# Patient Record
Sex: Female | Born: 1975
Health system: Southern US, Community
[De-identification: ages and names within clinical notes are randomized; demographics above are authoritative.]

## PROBLEM LIST (undated history)

## (undated) DIAGNOSIS — K649 Unspecified hemorrhoids: Secondary | ICD-10-CM

## (undated) DIAGNOSIS — I8290 Acute embolism and thrombosis of unspecified vein: Secondary | ICD-10-CM

## (undated) DIAGNOSIS — T7840XA Allergy, unspecified, initial encounter: Secondary | ICD-10-CM

## (undated) DIAGNOSIS — D649 Anemia, unspecified: Secondary | ICD-10-CM

## (undated) DIAGNOSIS — R011 Cardiac murmur, unspecified: Secondary | ICD-10-CM

## (undated) DIAGNOSIS — G709 Myoneural disorder, unspecified: Secondary | ICD-10-CM

## (undated) DIAGNOSIS — J45909 Unspecified asthma, uncomplicated: Secondary | ICD-10-CM

## (undated) DIAGNOSIS — K529 Noninfective gastroenteritis and colitis, unspecified: Secondary | ICD-10-CM

## (undated) HISTORY — DX: Noninfective gastroenteritis and colitis, unspecified: K52.9

## (undated) HISTORY — DX: Anemia, unspecified: D64.9

## (undated) HISTORY — DX: Acute embolism and thrombosis of unspecified vein: I82.90

## (undated) HISTORY — PX: WISDOM TOOTH EXTRACTION: SHX21

## (undated) HISTORY — DX: Unspecified asthma, uncomplicated: J45.909

## (undated) HISTORY — DX: Allergy, unspecified, initial encounter: T78.40XA

## (undated) HISTORY — PX: APPENDECTOMY: SHX54

## (undated) HISTORY — DX: Myoneural disorder, unspecified: G70.9

## (undated) HISTORY — DX: Cardiac murmur, unspecified: R01.1

## (undated) HISTORY — PX: KNEE SURGERY: SHX244

---

## 2003-01-21 ENCOUNTER — Other Ambulatory Visit: Admission: RE | Admit: 2003-01-21 | Discharge: 2003-01-21 | Payer: Self-pay | Admitting: Obstetrics and Gynecology

## 2004-04-12 ENCOUNTER — Other Ambulatory Visit: Admission: RE | Admit: 2004-04-12 | Discharge: 2004-04-12 | Payer: Self-pay | Admitting: Obstetrics & Gynecology

## 2004-04-12 ENCOUNTER — Other Ambulatory Visit: Admission: RE | Admit: 2004-04-12 | Discharge: 2004-04-12 | Payer: Self-pay | Admitting: Gynecology

## 2004-09-03 ENCOUNTER — Emergency Department (HOSPITAL_COMMUNITY): Admission: EM | Admit: 2004-09-03 | Discharge: 2004-09-04 | Payer: Self-pay | Admitting: Emergency Medicine

## 2004-09-15 ENCOUNTER — Ambulatory Visit (HOSPITAL_COMMUNITY): Admission: RE | Admit: 2004-09-15 | Discharge: 2004-09-15 | Payer: Self-pay

## 2004-09-25 ENCOUNTER — Emergency Department (HOSPITAL_COMMUNITY): Admission: EM | Admit: 2004-09-25 | Discharge: 2004-09-25 | Payer: Self-pay | Admitting: Emergency Medicine

## 2004-09-26 ENCOUNTER — Emergency Department (HOSPITAL_COMMUNITY): Admission: EM | Admit: 2004-09-26 | Discharge: 2004-09-26 | Payer: Self-pay | Admitting: Emergency Medicine

## 2006-05-31 ENCOUNTER — Ambulatory Visit (HOSPITAL_COMMUNITY): Admission: RE | Admit: 2006-05-31 | Discharge: 2006-05-31 | Payer: Self-pay | Admitting: Obstetrics & Gynecology

## 2006-05-31 ENCOUNTER — Encounter (INDEPENDENT_AMBULATORY_CARE_PROVIDER_SITE_OTHER): Payer: Self-pay | Admitting: Specialist

## 2006-10-17 ENCOUNTER — Observation Stay (HOSPITAL_COMMUNITY): Admission: EM | Admit: 2006-10-17 | Discharge: 2006-10-18 | Payer: Self-pay | Admitting: Emergency Medicine

## 2007-06-16 ENCOUNTER — Inpatient Hospital Stay (HOSPITAL_COMMUNITY): Admission: AD | Admit: 2007-06-16 | Discharge: 2007-06-18 | Payer: Self-pay | Admitting: Obstetrics & Gynecology

## 2007-11-16 ENCOUNTER — Emergency Department (HOSPITAL_COMMUNITY): Admission: EM | Admit: 2007-11-16 | Discharge: 2007-11-16 | Payer: Self-pay | Admitting: Emergency Medicine

## 2007-11-20 ENCOUNTER — Encounter: Admission: RE | Admit: 2007-11-20 | Discharge: 2007-11-20 | Payer: Self-pay | Admitting: Family Medicine

## 2008-02-23 ENCOUNTER — Emergency Department (HOSPITAL_COMMUNITY): Admission: EM | Admit: 2008-02-23 | Discharge: 2008-02-23 | Payer: Self-pay | Admitting: Emergency Medicine

## 2008-09-08 ENCOUNTER — Emergency Department (HOSPITAL_BASED_OUTPATIENT_CLINIC_OR_DEPARTMENT_OTHER): Admission: EM | Admit: 2008-09-08 | Discharge: 2008-09-08 | Payer: Self-pay | Admitting: Emergency Medicine

## 2009-02-03 ENCOUNTER — Emergency Department (HOSPITAL_BASED_OUTPATIENT_CLINIC_OR_DEPARTMENT_OTHER): Admission: EM | Admit: 2009-02-03 | Discharge: 2009-02-03 | Payer: Self-pay | Admitting: Emergency Medicine

## 2009-07-19 ENCOUNTER — Inpatient Hospital Stay (HOSPITAL_COMMUNITY): Admission: AD | Admit: 2009-07-19 | Discharge: 2009-07-19 | Payer: Self-pay | Admitting: Obstetrics & Gynecology

## 2009-07-20 ENCOUNTER — Inpatient Hospital Stay (HOSPITAL_COMMUNITY): Admission: AD | Admit: 2009-07-20 | Discharge: 2009-07-22 | Payer: Self-pay | Admitting: Obstetrics and Gynecology

## 2009-12-01 ENCOUNTER — Emergency Department (HOSPITAL_BASED_OUTPATIENT_CLINIC_OR_DEPARTMENT_OTHER): Admission: EM | Admit: 2009-12-01 | Discharge: 2009-12-01 | Payer: Self-pay | Admitting: Emergency Medicine

## 2010-02-07 ENCOUNTER — Emergency Department (HOSPITAL_BASED_OUTPATIENT_CLINIC_OR_DEPARTMENT_OTHER): Admission: EM | Admit: 2010-02-07 | Discharge: 2010-02-07 | Payer: Self-pay | Admitting: Emergency Medicine

## 2010-04-06 ENCOUNTER — Emergency Department (HOSPITAL_BASED_OUTPATIENT_CLINIC_OR_DEPARTMENT_OTHER): Admission: EM | Admit: 2010-04-06 | Discharge: 2010-04-06 | Payer: Self-pay | Admitting: Emergency Medicine

## 2011-01-02 ENCOUNTER — Emergency Department (HOSPITAL_BASED_OUTPATIENT_CLINIC_OR_DEPARTMENT_OTHER)
Admission: EM | Admit: 2011-01-02 | Discharge: 2011-01-03 | Disposition: A | Discharge status: Home / Self Care | Payer: 59 | Attending: Emergency Medicine | Admitting: Emergency Medicine

## 2011-01-02 DIAGNOSIS — J069 Acute upper respiratory infection, unspecified: Secondary | ICD-10-CM | POA: Insufficient documentation

## 2011-01-09 LAB — BASIC METABOLIC PANEL
BUN: 19 mg/dL (ref 6–23)
Chloride: 106 mEq/L (ref 96–112)
Creatinine, Ser: 0.6 mg/dL (ref 0.4–1.2)
GFR calc Af Amer: >60 mL/min (ref >60)
GFR calc non Af Amer: >60 mL/min (ref >60)
Potassium: 3.5 mEq/L (ref 3.5–5.1)

## 2011-01-10 LAB — BASIC METABOLIC PANEL
CO2: 29 mEq/L (ref 19–32)
Calcium: 9.1 mg/dL (ref 8.4–10.5)
Chloride: 103 mEq/L (ref 96–112)
Creatinine, Ser: 0.7 mg/dL (ref 0.4–1.2)
GFR calc Af Amer: >60 mL/min (ref >60)
Sodium: 143 mEq/L (ref 135–145)

## 2011-01-10 LAB — URINALYSIS, ROUTINE W REFLEX MICROSCOPIC
Glucose, UA: NEGATIVE mg/dL
Nitrite: NEGATIVE
Specific Gravity, Urine: 1.024 (ref 1.005–1.030)
pH: 5 (ref 5.0–8.0)

## 2011-01-10 LAB — DIFFERENTIAL
Basophils Relative: 0 % (ref 0–1)
Eosinophils Relative: 2 % (ref 0–5)
Lymphocytes Relative: 10 % — ABNORMAL LOW (ref 12–46)
Neutro Abs: 15.2 10*3/uL — ABNORMAL HIGH (ref 1.7–7.7)
Neutrophils Relative %: 84 % — ABNORMAL HIGH (ref 43–77)

## 2011-01-10 LAB — CBC
Hemoglobin: 14.3 g/dL (ref 12.0–15.0)
RBC: 4.49 MIL/uL (ref 3.87–5.11)
WBC: 18.1 10*3/uL — ABNORMAL HIGH (ref 4.0–10.5)

## 2011-01-10 LAB — PREGNANCY, URINE: Preg Test, Ur: NEGATIVE

## 2011-01-26 LAB — CBC
Hemoglobin: 10.8 g/dL — ABNORMAL LOW (ref 12.0–15.0)
Hemoglobin: 12.2 g/dL (ref 12.0–15.0)
MCV: 88.9 fL (ref 78.0–100.0)
Platelets: 231 10*3/uL (ref 150–400)
RBC: 3.56 MIL/uL — ABNORMAL LOW (ref 3.87–5.11)
RBC: 4.11 MIL/uL (ref 3.87–5.11)
RDW: 15 % (ref 11.5–15.5)
RDW: 15.4 % (ref 11.5–15.5)
WBC: 11.2 10*3/uL — ABNORMAL HIGH (ref 4.0–10.5)
WBC: 12.8 10*3/uL — ABNORMAL HIGH (ref 4.0–10.5)

## 2011-01-31 LAB — URINALYSIS, ROUTINE W REFLEX MICROSCOPIC
Bilirubin Urine: NEGATIVE
Nitrite: NEGATIVE
Protein, ur: NEGATIVE mg/dL
Specific Gravity, Urine: 1.015 (ref 1.005–1.030)
Urobilinogen, UA: 0.2 mg/dL (ref 0.0–1.0)

## 2011-03-09 NOTE — Discharge Summary (Signed)
NAME:  Mariah [name], Mariah [name]                ACCOUNT NO.:  [id number]   MEDICAL RECORD NO.:  [id number]          PATIENT TYPE:  INP   LOCATION:  5014                         FACILITY:  [facility]   PHYSICIAN:  Corinna L. Lendell Caprice, MDDATE OF BIRTH:  [birth year]   DATE OF ADMISSION:  10/17/2006  DATE OF DISCHARGE:  10/18/2006                               DISCHARGE SUMMARY   DISCHARGE DIAGNOSES:  1. Viral gastroenteritis.  2. Intrauterine pregnancy 5 weeks.   DISCHARGE MEDICATIONS:  1. Phenergan 12.5 mg p.o. q.6 h. p.r.n. nausea.  2. Imodium 2 mg p.o. as needed for diarrhea.   FOLLOWUP:  With of Dr. Varney Baas.   CONDITION:  Stable.   ACTIVITY:  Ad lib.   DIET:  Should include adequate fluids.   CONSULTATIONS:  None.   PROCEDURES:  None.   LABS:  CBC is significant for A white blood cell count of 13,000;  otherwise unremarkable.  Complete metabolic panel unremarkable, except  for an albumin of 3.2.  Amylase and lipase normal.  Quantitative hCG  6302 and urine hCG positive.  Specific gravity 1.022, ketones 40,  negative blood, negative protein, negative nitrite, negative leukocyte  esterase.   HISTORY AND HOSPITAL COURSE:  Mariah [name] is a pleasant 35 year old  unassigned Hispanic female who returned from [place] recently; and has  had intractable vomiting and diarrhea for 24 hours.  Her last menstrual  period was November 14.  Her husband has been admitted, reportedly, with  similar symptoms.  She had continued intractable vomiting in the  emergency room despite Zofran and Phenergan.  She was also having some  abdominal pain and cramping; so she was admitted for IV fluids and  supportive care.   Her vomiting and diarrhea improved quickly.  At this time she is  tolerating a liquid diet and crackers, actually also small amounts of  solid food.  She is feeling better; and is able to be discharged.  She  is to followup with Dr. Jennette Kettle for prenatal care.      Corinna L. Lendell Caprice, MD  Electronically Signed     CLS/MEDQ  D:  10/18/2006  T:  10/18/2006  Job:  [id number]

## 2011-03-09 NOTE — Op Note (Signed)
[name], [name]             ACCOUNT NO.:  [id number]   MEDICAL RECORD NO.:  [id number]          PATIENT TYPE:  AMB   LOCATION:  SDC                           FACILITY:  [facility]   PHYSICIAN:  Freddy Finner, M.D.   DATE OF BIRTH:  July 01, 1976   DATE OF PROCEDURE:  05/30/2006  DATE OF DISCHARGE:                                 OPERATIVE REPORT   PREOPERATIVE DIAGNOSIS:  Missed abortion.   POSTOPERATIVE DIAGNOSIS:  Missed abortion.   PROCEDURE:  D&E.   SURGEON:  Freddy Finner, M.D.   ANESTHESIA:  IV sedation and paracervical block.   INTRAOPERATIVE COMPLICATIONS:  None.   ESTIMATED BLOOD LOSS:  100 cc or less.   INDICATIONS FOR PROCEDURE:  The patient is a 35 year old Spanish single  female with her fourth pregnancy who has previously had pregnancy  termination and two spontaneous abortions.  She presented to the office on  May 28, 2006 for initial obstetrical evaluation and at that time by dates  was 11-1/[redacted] weeks gestation.  On her examination, the uterus was felt to be  smaller than dates, and ultrasound was obtained which showed a 9-week size  empty sac with no evidence of fetal parts or heart activity or yolk sac.  A  diagnosis of missed abortion was made.  She is now admitted for D&E.   DESCRIPTION OF PROCEDURE:  She was admitted on the morning of surgery.  She  was brought to the operating room and there placed under IV sedation.  She  was placed in the dorsal lithotomy position.  Betadine prep of the mons,  perineum, and vagina was carried out.  Sterile drapes were applied.   A bivalve speculum was introduced to visualize the cervix which was grasped  on the anterior lip with a single-tooth tenaculum.  The cervix was  progressively dilated with Pratts to allow passage of an 8-mm suction  cannula.  Application of vacuum suction did produce products of conception.  Gentle sharp curettage and exploration with Randall stone forceps was  carried out followed by repeat  vacuum aspiration until it was felt that the  cavity was completely evacuated.  The procedure was terminated.  The  instruments were removed.   The patient was awakened and taken to recovery in good condition.  She was  given Darvocet-N 100 to be taken one or two every four hours as needed for  postoperative pain unrelieved by over-the-counter preparations.      Freddy Finner, M.D.  Electronically Signed     WRN/MEDQ  D:  06/01/2006  T:  06/01/2006  Job:  [id number]

## 2011-03-09 NOTE — H&P (Signed)
NAME:  Mariah [name], Mariah [name]                ACCOUNT NO.:  [id number]   MEDICAL RECORD NO.:  [id number]          PATIENT TYPE:  INP   LOCATION:  5014                         FACILITY:  [facility]   PHYSICIAN:  Corinna L. Lendell Caprice, MDDATE OF BIRTH:  1976/08/22   DATE OF ADMISSION:  10/17/2006  DATE OF DISCHARGE:  10/18/2006                              HISTORY & PHYSICAL   CHIEF COMPLAINT:  Vomiting and diarrhea.   HPI:  Mariah [name] is an, unassigned, 35 year old Hispanic female who has  had 24 hours of intractable vomiting and diarrhea.  She has had Zofran,  Dilaudid and Phenergan and continues to have intractable vomiting here  in the emergency room and I am asked to admit for supportive care.  Patient's husband was admitted recently to the hospital with similar  symptoms.  She recently returned from [place].  She has had some  subjective fevers and chills.  Her last menstrual period was September 04, 2006.   PAST MEDICAL HISTORY:  None.   MEDICATIONS:  None.   ALLERGIES:  NONE.   SOCIAL HISTORY:  Patient is married.  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  She  denies drinking, smoking or drugs.   FAMILY HISTORY:  Noncontributory.   REVIEW OF SYSTEMS:  As above; otherwise, negative.   PHYSICAL EXAMINATION:  VITAL SIGNS:  Temperature is 99.7.  Heart rate  116.  Respiratory rate 20.  Blood pressure 199/67.  GENERAL:  Patient is uncomfortable appearing white female with an emesis  basin at her side.  HEENT:  Normocephalic, atraumatic.  Pupils equal, round and reactive to  light.  Slightly dry mucous membranes.  NECK:  Supple.  No  lymphadenopathy.  LUNGS:  Clear to auscultation bilaterally without wheezes, rhonchi or  rales.  CARDIOVASCULAR:  Fast, regular.  No murmurs, gallops or rubs.  ABDOMEN:  Normal bowel sounds.  Soft, mild tenderness.  GU/RECTAL:  Deferred.  EXTREMITIES:  No clubbing, cyanosis or edema.  SKIN:  No rash.  PSYCHIATRIC:  Normal affect.  NEUROLOGIC:  Alert and oriented.   Cranial nerves and sensory motor exam  are intact.   LABS:  UA is negative for nitrites, leukocyte esterase, blood, protein,  there is 40 ketones, specific gravity 1.022.  Urine hCG positive,  quantitative hCG 6302.  BMET unremarkable.  H&H unremarkable.   SPECIAL STUDIES IN RADIOLOGY:  Pelvic ultrasound shows a probable  gestational sac with yoke sac within, corresponds to an intrauterine  pregnancy of approximately 5 weeks 4 days.  Probable left ovarian corpus  luteal cyst.   ASSESSMENT/PLAN:  1. Vomiting and diarrhea.  Suspect viral gastroenteritis as patient      has failed to improve in the emergency room, I will admit her for      IV fluids, antiemetics and supportive care.  2. Five week intrauterine pregnancy.  She has seen Dr. Jennette Kettle in the      past and will need outpatient followup.      Corinna L. Lendell Caprice, MD  Electronically Signed     CLS/MEDQ  D:  10/18/2006  T:  10/19/2006  Job:  [id number]

## 2011-07-12 LAB — DIFFERENTIAL
Basophils Absolute: 0
Basophils Relative: 0
Lymphocytes Relative: 17
Monocytes Absolute: 0.3
Neutro Abs: 3.4
Neutrophils Relative %: 75

## 2011-07-12 LAB — CBC
Hemoglobin: 13.9
MCHC: 35.3
Platelets: 228
RDW: 13.3

## 2011-07-12 LAB — BASIC METABOLIC PANEL
CO2: 23
Calcium: 9
Creatinine, Ser: 0.6
GFR calc non Af Amer: >60
Glucose, Bld: 109 — ABNORMAL HIGH
Sodium: 133 — ABNORMAL LOW

## 2011-07-25 LAB — RAPID STREP SCREEN (MED CTR MEBANE ONLY): Streptococcus, Group A Screen (Direct): NEGATIVE

## 2011-08-03 LAB — CBC
HCT: 31 — ABNORMAL LOW
Hemoglobin: 10.7 — ABNORMAL LOW
Hemoglobin: 12.9
MCHC: 35.1
RBC: 3.35 — ABNORMAL LOW
RBC: 4.02
RDW: 14.5 — ABNORMAL HIGH
WBC: 12.5 — ABNORMAL HIGH
WBC: 17.7 — ABNORMAL HIGH

## 2011-08-03 LAB — RAPID HIV SCREEN (WH-MAU): Rapid HIV Screen: NONREACTIVE

## 2011-08-03 LAB — RPR: RPR Ser Ql: NONREACTIVE

## 2015-06-08 ENCOUNTER — Encounter (HOSPITAL_BASED_OUTPATIENT_CLINIC_OR_DEPARTMENT_OTHER): Payer: Self-pay

## 2015-06-08 ENCOUNTER — Emergency Department (HOSPITAL_BASED_OUTPATIENT_CLINIC_OR_DEPARTMENT_OTHER): Payer: 59

## 2015-06-08 ENCOUNTER — Emergency Department (HOSPITAL_BASED_OUTPATIENT_CLINIC_OR_DEPARTMENT_OTHER)
Admission: EM | Admit: 2015-06-08 | Discharge: 2015-06-08 | Disposition: A | Discharge status: Home / Self Care | Payer: 59 | Attending: Emergency Medicine | Admitting: Emergency Medicine

## 2015-06-08 DIAGNOSIS — Z3202 Encounter for pregnancy test, result negative: Secondary | ICD-10-CM | POA: Insufficient documentation

## 2015-06-08 DIAGNOSIS — R1013 Epigastric pain: Secondary | ICD-10-CM | POA: Insufficient documentation

## 2015-06-08 DIAGNOSIS — K625 Hemorrhage of anus and rectum: Secondary | ICD-10-CM | POA: Insufficient documentation

## 2015-06-08 HISTORY — DX: Unspecified hemorrhoids: K64.9

## 2015-06-08 LAB — URINALYSIS, ROUTINE W REFLEX MICROSCOPIC
BILIRUBIN URINE: NEGATIVE
Glucose, UA: NEGATIVE mg/dL
KETONES UR: NEGATIVE mg/dL
Leukocytes, UA: NEGATIVE
NITRITE: NEGATIVE
PH: 5.5 (ref 5.0–8.0)
Protein, ur: NEGATIVE mg/dL
Specific Gravity, Urine: 1.024 (ref 1.005–1.030)
UROBILINOGEN UA: 0.2 mg/dL (ref 0.0–1.0)

## 2015-06-08 LAB — BASIC METABOLIC PANEL
ANION GAP: 7 (ref 5–15)
BUN: 14 mg/dL (ref 6–20)
CALCIUM: 9.1 mg/dL (ref 8.9–10.3)
CO2: 26 mmol/L (ref 22–32)
CREATININE: 0.62 mg/dL (ref 0.44–1.00)
Chloride: 103 mmol/L (ref 101–111)
GLUCOSE: 137 mg/dL — AB (ref 65–99)
Potassium: 3.6 mmol/L (ref 3.5–5.1)
Sodium: 136 mmol/L (ref 135–145)

## 2015-06-08 LAB — CBC WITH DIFFERENTIAL/PLATELET
BASOS ABS: 0 10*3/uL (ref 0.0–0.1)
BASOS PCT: 0 % (ref 0–1)
EOS PCT: 0 % (ref 0–5)
Eosinophils Absolute: 0.1 10*3/uL (ref 0.0–0.7)
HCT: 39.8 % (ref 36.0–46.0)
Hemoglobin: 13.7 g/dL (ref 12.0–15.0)
Lymphocytes Relative: 8 % — ABNORMAL LOW (ref 12–46)
Lymphs Abs: 1.1 10*3/uL (ref 0.7–4.0)
MCH: 31 pg (ref 26.0–34.0)
MCHC: 34.4 g/dL (ref 30.0–36.0)
MCV: 90 fL (ref 78.0–100.0)
MONO ABS: 1 10*3/uL (ref 0.1–1.0)
Monocytes Relative: 7 % (ref 3–12)
Neutro Abs: 11.4 10*3/uL — ABNORMAL HIGH (ref 1.7–7.7)
Neutrophils Relative %: 85 % — ABNORMAL HIGH (ref 43–77)
PLATELETS: 244 10*3/uL (ref 150–400)
RBC: 4.42 MIL/uL (ref 3.87–5.11)
RDW: 12.5 % (ref 11.5–15.5)
WBC: 13.5 10*3/uL — ABNORMAL HIGH (ref 4.0–10.5)

## 2015-06-08 LAB — URINE MICROSCOPIC-ADD ON

## 2015-06-08 LAB — PREGNANCY, URINE: Preg Test, Ur: NEGATIVE

## 2015-06-08 LAB — OCCULT BLOOD X 1 CARD TO LAB, STOOL: FECAL OCCULT BLD: POSITIVE — AB

## 2015-06-08 MED ORDER — SODIUM CHLORIDE 0.9 % IV BOLUS (SEPSIS)
500.0000 mL | Freq: Once | INTRAVENOUS | Status: AC
  Administered 2015-06-08: 500 mL via INTRAVENOUS

## 2015-06-08 MED ORDER — LIDOCAINE HCL 2 % EX GEL: 1.0000 "application " | CUTANEOUS | Status: DC | PRN

## 2015-06-08 MED ORDER — IOHEXOL 300 MG/ML  SOLN
100.0000 mL | Freq: Once | INTRAMUSCULAR | Status: AC | PRN
  Administered 2015-06-08: 100 mL via INTRAVENOUS

## 2015-06-08 MED ORDER — IOHEXOL 300 MG/ML  SOLN
25.0000 mL | Freq: Once | INTRAMUSCULAR | Status: AC | PRN
  Administered 2015-06-08: 25 mL via ORAL

## 2015-06-08 MED ORDER — BISACODYL 5 MG PO TBEC: 5.0000 mg | DELAYED_RELEASE_TABLET | Freq: Two times a day (BID) | ORAL | Status: DC

## 2015-06-08 NOTE — ED Provider Notes (Signed)
CSN: [id number]     Arrival date & time 06/08/15  1406 History   First MD Initiated Contact with Patient 06/08/15 1417     Chief Complaint  Patient presents with  . Rectal Bleeding     (Consider location/radiation/quality/duration/timing/severity/associated sxs/prior Treatment) Patient is a 39 y.o. female presenting with hematochezia. The history is provided by the patient. No language interpreter was used.  Rectal Bleeding Associated symptoms: abdominal pain   Associated symptoms: no fever and no vomiting   Ms. Swartz is a 39 y.o female with a history of hemorrhoid who presents for rectal bleeding that began at 8 AM this morning. She states she had bleeding even without having a bowel movement. She reports the blood was bright red. Soon after she states she began feeling weak, nauseated with abdominal pain. She states she has had this before and was diagnosed with colitis when she lived in [place] but has not had any further testing and has never followed up with a gastroenterologist. Last BM was this morning. She denies any fever, chills, vomiting, diarrhea, mucous stool, dysuria, hematuria, urinary frequency, near syncope.  Past Medical History  Diagnosis Date  . Hemorrhoid    Past Surgical History  Procedure Laterality Date  . Knee surgery    . Appendectomy     No family history on file. Social History  Substance Use Topics  . Smoking status: Never Smoker   . Smokeless tobacco: None  . Alcohol Use: No   OB History    No data available     Review of Systems  Constitutional: Negative for fever.  Respiratory: Negative for shortness of breath.   Cardiovascular: Negative for chest pain.  Gastrointestinal: Positive for nausea, abdominal pain, blood in stool and hematochezia. Negative for vomiting, diarrhea, constipation and abdominal distention.  Genitourinary: Negative for dysuria.  All other systems reviewed and are negative.     Allergies  Robitussin (alcohol  free)  Home Medications   Prior to Admission medications   Medication Sig Start Date End Date Taking? Authorizing Provider  bisacodyl (DULCOLAX) 5 MG EC tablet Take 1 tablet (5 mg total) by mouth 2 (two) times daily. 06/08/15   Seung Nidiffer Patel-Mills, PA-C  lidocaine (XYLOCAINE) 2 % jelly Apply 1 application topically as needed. 06/08/15   Charleston Vierling Patel-Mills, PA-C   BP 114/68 mmHg  Pulse 89  Temp(Src) 98.3 F (36.8 C) (Oral)  Resp 20  SpO2 100%  LMP 05/11/2015 (Approximate) Physical Exam  Constitutional: She is oriented to person, place, and time. She appears well-developed and well-nourished.  HENT:  Head: Normocephalic and atraumatic.  Eyes: Conjunctivae are normal.  Neck: Normal range of motion. Neck supple.  Cardiovascular: Normal rate, regular rhythm and normal heart sounds.   Pulmonary/Chest: Effort normal and breath sounds normal. No respiratory distress.  Abdominal: Soft. Normal appearance. She exhibits no distension. There is tenderness in the epigastric area. There is no rebound and no guarding.    Genitourinary: Pelvic exam was performed with patient in the knee-chest position.  Rectal exam: Chaperone present. Significant tenderness on rectal exam. Moderate amounts of blood with no stool. No hemorrhoids.  Musculoskeletal: Normal range of motion.  Neurological: She is alert and oriented to person, place, and time.  Skin: Skin is warm and dry.  Psychiatric: She has a normal mood and affect. Her behavior is normal.  Nursing note and vitals reviewed.   ED Course  Procedures (including critical care time) Labs Review Labs Reviewed  OCCULT BLOOD X 1 CARD TO LAB,  STOOL - Abnormal; Notable for the following:    Fecal Occult Bld POSITIVE (*)    All other components within normal limits  CBC WITH DIFFERENTIAL/PLATELET - Abnormal; Notable for the following:    WBC 13.5 (*)    Neutrophils Relative % 85 (*)    Neutro Abs 11.4 (*)    Lymphocytes Relative 8 (*)    All other  components within normal limits  BASIC METABOLIC PANEL - Abnormal; Notable for the following:    Glucose, Bld 137 (*)    All other components within normal limits  URINALYSIS, ROUTINE W REFLEX MICROSCOPIC (NOT AT [facility]) - Abnormal; Notable for the following:    Hgb urine dipstick TRACE (*)    All other components within normal limits  PREGNANCY, URINE  URINE MICROSCOPIC-ADD ON    Imaging Review Ct Abdomen Pelvis W Contrast  06/08/2015   CLINICAL DATA:  Lower abdominal pain, rectal pain with bleeding. Nausea.  EXAM: CT ABDOMEN AND PELVIS WITH CONTRAST  TECHNIQUE: Multidetector CT imaging of the abdomen and pelvis was performed using the standard protocol following bolus administration of intravenous contrast.  CONTRAST:  25mL OMNIPAQUE IOHEXOL 300 MG/ML SOLN, OMNIPAQUE IOHEXOL 300 MG/ML SOLN  COMPARISON:  None.  FINDINGS: Lung bases are clear.  No effusions.  Heart is normal size.  Liver, gallbladder, spleen, pancreas, adrenals and kidneys are unremarkable. No hydronephrosis. Uterus, adnexae and urinary bladder unremarkable. Trace free fluid in the pelvis, likely physiologic.  Stomach, large and small bowel are unremarkable. No free air or adenopathy. Aorta is normal caliber.  No acute bony abnormality or focal bone lesion.  IMPRESSION: No acute findings in the abdomen or pelvis.   Electronically Signed   By: Charlett Nose M.D.   On: 06/08/2015 16:13   I have personally reviewed and evaluated these images and lab results as part of my medical decision-making.   EKG Interpretation None      MDM   Final diagnoses:  Rectal bleeding   Vitals are stable and she is well appearing.  No pallor or tachycardia. She does have rectal bleeding but hgb is stable.  No UTI and other labs are not concerning.  CT abdomen is negative for acute colitis, rectal abscess, or diverticulitis.  I discussed findings and return precautions with the patient.  I have given her GI follow.  Patient verbally agrees  with the plan. I prescribed lidocaine jelly and dulcolax. Medications  sodium chloride 0.9 % bolus 500 mL (0 mLs Intravenous Stopped 06/08/15 1608)  iohexol (OMNIPAQUE) 300 MG/ML solution 25 mL (25 mLs Oral Contrast Given 06/08/15 1513)  iohexol (OMNIPAQUE) 300 MG/ML solution 100 mL (100 mLs Intravenous Contrast Given 06/08/15 1549)      Catha Gosselin, PA-C 06/08/15 1724  Benjiman Core, MD 06/08/15 2322

## 2015-06-08 NOTE — Discharge Instructions (Signed)
Bloody Stools Follow up with GI. Return for fever, worsening pain or rectal pain, vomiting. Bloody stools often mean that there is a problem in the digestive tract. Your caregiver may use the term "melena" to describe black, tarry, and bad smelling stools or "hematochezia" to describe red or maroon-colored stools. Blood seen in the stool can be caused by bleeding anywhere along the intestinal tract.  A black stool usually means that blood is coming from the upper part of the gastrointestinal tract (esophagus, stomach, or small bowel). Passing maroon-colored stools or bright red blood usually means that blood is coming from lower down in the large bowel or the rectum. However, sometimes massive bleeding in the stomach or small intestine can cause bright red bloody stools.  Consuming black licorice, lead, iron pills, medicines containing bismuth subsalicylate, or blueberries can also cause black stools. Your caregiver can test black stools to see if blood is present. It is important that the cause of the bleeding be found. Treatment can then be started, and the problem can be corrected. Rectal bleeding may not be serious, but you should not assume everything is okay until you know the cause.It is very important to follow up with your caregiver or a specialist in gastrointestinal problems. CAUSES  Blood in the stools can come from various underlying causes.Often, the cause is not found during your first visit. Testing is often needed to discover the cause of bleeding in the gastrointestinal tract. Causes range from simple to serious or even life-threatening.Possible causes include:  Hemorrhoids.These are veins that are full of blood (engorged) in the rectum. They cause pain, inflammation, and may bleed.  Anal fissures.These are areas of painful tearing which may bleed. They are often caused by passing hard stool.  Diverticulosis.These are pouches that form on the colon over time, with age, and may  bleed significantly.  Diverticulitis.This is inflammation in areas with diverticulosis. It can cause pain, fever, and bloody stools, although bleeding is rare.  Proctitis and colitis. These are inflamed areas of the rectum or colon. They may cause pain, fever, and bloody stools.  Polyps and cancer. Colon cancer is a leading cause of preventable cancer death.It often starts out as precancerous polyps that can be removed during a colonoscopy, preventing progression into cancer. Sometimes, polyps and cancer may cause rectal bleeding.  Gastritis and ulcers.Bleeding from the upper gastrointestinal tract (near the stomach) may travel through the intestines and produce black, sometimes tarry, often bad smelling stools. In certain cases, if the bleeding is fast enough, the stools may not be black, but red and the condition may be life-threatening. SYMPTOMS  You may have stools that are bright red and bloody, that are normal color with blood on them, or that are dark black and tarry. In some cases, you may only have blood in the toilet bowl. Any of these cases need medical care. You may also have:  Pain at the anus or anywhere in the rectum.  Lightheadedness or feeling faint.  Extreme weakness.  Nausea or vomiting.  Fever. DIAGNOSIS Your caregiver may use the following methods to find the cause of your bleeding:  Taking a medical history. Age is important. Older people tend to develop polyps and cancer more often. If there is anal pain and a hard, large stool associated with bleeding, a tear of the anus may be the cause. If blood drips into the toilet after a bowel movement, bleeding hemorrhoids may be the problem. The color and frequency of the bleeding are additional  considerations. In most cases, the medical history provides clues, but seldom the final answer.  A visual and finger (digital) exam. Your caregiver will inspect the anal area, looking for tears and hemorrhoids. A finger exam can  provide information when there is tenderness or a growth inside. In men, the prostate is also examined.  Endoscopy. Several types of small, long scopes (endoscopes) are used to view the colon.  In the office, your caregiver may use a rigid, or more commonly, a flexible viewing sigmoidoscope. This exam is called flexible sigmoidoscopy. It is performed in 5 to 10 minutes.  A more thorough exam is accomplished with a colonoscope. It allows your caregiver to view the entire 5 to 6 foot long colon. Medicine to help you relax (sedative) is usually given for this exam. Frequently, a bleeding lesion may be present beyond the reach of the sigmoidoscope. So, a colonoscopy may be the best exam to start with. Both exams are usually done on an outpatient basis. This means the patient does not stay overnight in the hospital or surgery center.  An upper endoscopy may be needed to examine your stomach. Sedation is used and a flexible endoscope is put in your mouth, down to your stomach.  A barium enema X-ray. This is an X-ray exam. It uses liquid barium inserted by enema into the rectum. This test alone may not identify an actual bleeding point. X-rays highlight abnormal shadows, such as those made by lumps (tumors), diverticuli, or colitis. TREATMENT  Treatment depends on the cause of your bleeding.   For bleeding from the stomach or colon, the caregiver doing your endoscopy or colonoscopy may be able to stop the bleeding as part of the procedure.  Inflammation or infection of the colon can be treated with medicines.  Many rectal problems can be treated with creams, suppositories, or warm baths.  Surgery is sometimes needed.  Blood transfusions are sometimes needed if you have lost a lot of blood.  For any bleeding problem, let your caregiver know if you take aspirin or other blood thinners regularly. HOME CARE INSTRUCTIONS   Take any medicines exactly as prescribed.  Keep your stools soft by eating a  diet high in fiber. Prunes (1 to 3 a day) work well for many people.  Drink enough water and fluids to keep your urine clear or pale yellow.  Take sitz baths if advised. A sitz bath is when you sit in a bathtub with warm water for 10 to 15 minutes to soak, soothe, and cleanse the rectal area.  If enemas or suppositories are advised, be sure you know how to use them. Tell your caregiver if you have problems with this.  Monitor your bowel movements to look for signs of improvement or worsening. SEEK MEDICAL CARE IF:   You do not improve in the time expected.  Your condition worsens after initial improvement.  You develop any new symptoms. SEEK IMMEDIATE MEDICAL CARE IF:   You develop severe or prolonged rectal bleeding.  You vomit blood.  You feel weak or faint.  You have a fever. MAKE SURE YOU:  Understand these instructions.  Will watch your condition.  Will get help right away if you are not doing well or get worse. Document Released: 09/28/2002 Document Revised: 12/31/2011 Document Reviewed: 02/23/2011 [facility] Patient Information 2015 [place], [place]. This information is not intended to replace advice given to you by your health care provider. Make sure you discuss any questions you have with your health care provider.

## 2015-06-08 NOTE — ED Notes (Signed)
Rectal bleeding x 2 with BM today and once without BM-c/o weakness, nausea, abd pain and HA

## 2015-06-08 NOTE — ED Notes (Signed)
Patient departing with spouse.  Pt heading to pharmacy to pick up prescriptions.

## 2015-06-09 ENCOUNTER — Telehealth: Payer: Self-pay | Admitting: Internal Medicine

## 2015-06-09 NOTE — Telephone Encounter (Signed)
Pt was seen in the ER for abdominal pain and rectal bleeding, told to follow-up with GI. Pt scheduled to see Doug Sou PA tomorrow at 1:30pm. Pt aware of appt.

## 2015-06-09 NOTE — Telephone Encounter (Signed)
Attempted to call pt. Received message that voicemail is full. Will try again later.

## 2015-06-10 ENCOUNTER — Ambulatory Visit (INDEPENDENT_AMBULATORY_CARE_PROVIDER_SITE_OTHER): Payer: 59 | Admitting: Gastroenterology

## 2015-06-10 ENCOUNTER — Encounter: Payer: Self-pay | Admitting: Gastroenterology

## 2015-06-10 VITALS — BP 98/60 | HR 72 | Ht 61.5 in | Wt 137.4 lb

## 2015-06-10 DIAGNOSIS — R1031 Right lower quadrant pain: Secondary | ICD-10-CM | POA: Diagnosis not present

## 2015-06-10 DIAGNOSIS — R197 Diarrhea, unspecified: Secondary | ICD-10-CM | POA: Diagnosis not present

## 2015-06-10 DIAGNOSIS — K625 Hemorrhage of anus and rectum: Secondary | ICD-10-CM | POA: Diagnosis not present

## 2015-06-10 MED ORDER — NA SULFATE-K SULFATE-MG SULF 17.5-3.13-1.6 GM/177ML PO SOLN: 1.0000 | Freq: Once | ORAL | Status: DC

## 2015-06-10 MED ORDER — DICYCLOMINE HCL 10 MG PO CAPS: 10.0000 mg | ORAL_CAPSULE | Freq: Two times a day (BID) | ORAL | Status: DC

## 2015-06-10 NOTE — Progress Notes (Signed)
06/10/2015 Mariah [name] [id number] [birth year]   HISTORY OF PRESENT ILLNESS:  This is a 39 year old female who had sudden onset of nausea, diarrhea, rectal bleeding, and abdominal pain 3 days ago.  Referred here from the ED where she was 2 days ago for these symptoms.  Was doing well until these symptoms began suddenly.  Now have 4 yellow, watery stools daily.  Passing some bright red blood at times even without stool (on TP and in toilet).  Having lower abdominal pain, mostly in RLQ.  Apparently had some similar symptoms in the past when she lived in [place] and was diagnosed with colitis, but no GI work-up done.  Denies recent travel, sick contacts, antibiotics, and bad food exposure.  CT scan abdomen and pelvis with contrast 2 days ago, 8/17, was unremarkable.  This was during ED visit.  At that time labs revealed WBC count somewhat elevated at 13.5; Hgb normal at 13.7 grams.  BMP ok.   Past Medical History  Diagnosis Date  . Hemorrhoid   . Colitis    Past Surgical History  Procedure Laterality Date  . Knee surgery Right     tumor removed  . Appendectomy      reports that she has never smoked. She has never used smokeless tobacco. She reports that she drinks alcohol. She reports that she does not use illicit drugs. family history includes Cancer in her paternal grandmother; Gallstones in her mother; Nephrolithiasis in her sister; Pancreatic cancer in her father; Prostate cancer in her maternal grandfather. Allergies  Allergen Reactions  . Robitussin (Alcohol Free) [Guaifenesin] Swelling      Outpatient Encounter Prescriptions as of 06/10/2015  Medication Sig  . bisacodyl (DULCOLAX) 5 MG EC tablet Take 1 tablet (5 mg total) by mouth 2 (two) times daily.  Marland Kitchen lidocaine (XYLOCAINE) 2 % jelly Apply 1 application topically as needed.  . Multiple Vitamin (MULTIVITAMIN) tablet Take 1 tablet by mouth daily.   No facility-administered encounter medications on file as of 06/10/2015.      REVIEW OF SYSTEMS  : All other systems reviewed and negative except where noted in the History of Present Illness.   PHYSICAL EXAM: BP 98/60 mmHg  Pulse 72  Ht 5' 1.5" (1.562 m)  Wt 137 lb 6 oz (62.313 kg)  BMI 25.54 kg/m2  LMP 05/11/2015 (Approximate) General: Well developed white female in no acute distress, but appears somewhat uncomfortable. Head: Normocephalic and atraumatic Eyes:  Sclerae anicteric,conjunctiva pink. Ears: Normal auditory acuity Lungs: Clear throughout to auscultation Heart: Regular rate and rhythm Abdomen: Soft, non-distended.  Normal bowel sounds.  Mild diffuse TTP but greatest in RLQ without R/R/G. Musculoskeletal: Symmetrical with no gross deformities  Skin: No lesions on visible extremities Extremities: No edema  Neurological: Alert oriented x 4, grossly non-focal Psychological:  Alert and cooperative. Normal mood and affect  ASSESSMENT AND PLAN: -Sudden onset diarrhea, rectal bleeding, abdominal pain, and nausea:  CT scan negative.  Certainly sounds infectious, but apparently diagnosed with "colitis" in [place], but no issues until 3 days ago.  ? If that was infectious as well.  Will check stool culture and Cdiff.  If stool studies negative and symptoms persist then will proceed with colonoscopy.  Will schedule today with Dr. Marina Goodell and can cancel if needed. The risks, benefits, and alternatives to colonoscopy were discussed with the patient and she consents to proceed.  Will try bentyl 10 mg BID for abdominal cramping.  If symptoms worsen then will return to  the ED.     CC:  Mitchell, L.August Saucer, MD

## 2015-06-10 NOTE — Patient Instructions (Signed)
Your physician has requested that you go to the basement for  lab work before leaving today.  We have sent the following medications to your pharmacy for you to pick up at your convenience:Bentyl.  You have been scheduled for a colonoscopy. Please follow written instructions given to you at your visit today.  Please pick up your prep supplies at the pharmacy within the next 1-3 days. If you use inhalers (even only as needed), please bring them with you on the day of your procedure.

## 2015-06-13 ENCOUNTER — Other Ambulatory Visit: Payer: 59

## 2015-06-13 DIAGNOSIS — K625 Hemorrhage of anus and rectum: Secondary | ICD-10-CM

## 2015-06-13 DIAGNOSIS — R197 Diarrhea, unspecified: Secondary | ICD-10-CM

## 2015-06-13 DIAGNOSIS — R1031 Right lower quadrant pain: Secondary | ICD-10-CM

## 2015-06-14 LAB — CLOSTRIDIUM DIFFICILE BY PCR: Toxigenic C. Difficile by PCR: NOT DETECTED

## 2015-06-17 LAB — STOOL CULTURE

## 2015-06-19 DIAGNOSIS — R1031 Right lower quadrant pain: Secondary | ICD-10-CM | POA: Insufficient documentation

## 2015-06-19 DIAGNOSIS — R197 Diarrhea, unspecified: Secondary | ICD-10-CM | POA: Insufficient documentation

## 2015-06-19 DIAGNOSIS — K625 Hemorrhage of anus and rectum: Secondary | ICD-10-CM | POA: Insufficient documentation

## 2015-06-20 NOTE — Progress Notes (Signed)
Agree with initial assessment and plans as outlined 

## 2015-06-28 ENCOUNTER — Ambulatory Visit (AMBULATORY_SURGERY_CENTER): Payer: 59 | Admitting: Internal Medicine

## 2015-06-28 ENCOUNTER — Encounter: Payer: Self-pay | Admitting: Internal Medicine

## 2015-06-28 VITALS — BP 100/59 | HR 63 | Temp 97.7°F | Resp 16 | Ht 61.5 in | Wt 137.0 lb

## 2015-06-28 DIAGNOSIS — K625 Hemorrhage of anus and rectum: Secondary | ICD-10-CM | POA: Diagnosis not present

## 2015-06-28 DIAGNOSIS — R197 Diarrhea, unspecified: Secondary | ICD-10-CM

## 2015-06-28 MED ORDER — SODIUM CHLORIDE 0.9 % IV SOLN: 500.0000 mL | INTRAVENOUS | Status: DC

## 2015-06-28 NOTE — Progress Notes (Signed)
To recovery, report to Mirts, RN, VSS. 

## 2015-06-28 NOTE — Op Note (Signed)
[facility] [address] [place], [address]   COLONOSCOPY PROCEDURE REPORT  PATIENT: Mariah [name], Mariah [name]  MR#: [id number] BIRTHDATE: [birth year] , 38  yrs. old GENDER: female ENDOSCOPIST: Roxy Cedar, MD REFERRED XB:JYNW Mitchell, M.D. PROCEDURE DATE:  06/28/2015 PROCEDURE:   Colonoscopy, diagnostic First Screening Colonoscopy - Avg.  risk and is 50 yrs.  old or older - No.  Prior Negative Screening - Now for repeat screening. N/A  History of Adenoma - Now for follow-up colonoscopy & has been > or = to 3 yrs.  N/A  Polyps removed today? No Recommend repeat exam, <10 yrs? No ASA CLASS:   Class I INDICATIONS:change in bowel habits(loose) and rectal bleeding. Reported unspecified "colitis" in [place] previously. MEDICATIONS: Monitored anesthesia care and Propofol 200 mg IV  DESCRIPTION OF PROCEDURE:   After the risks benefits and alternatives of the procedure were thoroughly explained, informed consent was obtained.  The digital rectal exam revealed no abnormalities of the rectum.   The [id number]  endoscope was introduced through the anus and advanced to the cecum, which was identified by both the appendix and ileocecal valve. No adverse events experienced.   The quality of the prep was excellent. (MoviPrep was used)  The instrument was then slowly withdrawn as the colon was fully examined. Estimated blood loss is zero unless otherwise noted in this procedure report.  COLON FINDINGS: The examined terminal ileum appeared to be normal. A normal appearing cecum, ileocecal valve, and appendiceal orifice were identified.  The ascending, transverse, descending, sigmoid colon, and rectum appeared unremarkable.  Retroflexed views revealed internal hemorrhoids. The time to cecum = 3.9 Withdrawal time = 5.5   The scope was withdrawn and the procedure completed. COMPLICATIONS: There were no immediate complications.  ENDOSCOPIC IMPRESSION: 1.   The examined  terminal ileum appeared normal 2.   Normal colonoscopy 3.  Suspect self-limited infectious process 3 weeks ago. Now with occasional colonic spasm. No evidence for inflammatory bowel disease or active colitis  RECOMMENDATIONS: 1.  Continue current medications. Okay to use dicyclomine (Bentyl) if needed for abdominal discomfort 2.  Continue current colorectal screening recommendations for "routine risk" patients with a repeat colonoscopy age 40. 3. Return to the care of your primary provider. GI follow-up as needed  eSigned:  Roxy Cedar, MD 06/28/2015 8:20 AM   cc: The Patient and Lupe Carney, MD

## 2015-06-28 NOTE — Patient Instructions (Signed)
YOU HAD AN ENDOSCOPIC PROCEDURE TODAY AT THE [facility]:   Refer to the procedure report that was given to you for any specific questions about what was found during the examination.  If the procedure report does not answer your questions, please call your gastroenterologist to clarify.  If you requested that your care partner not be given the details of your procedure findings, then the procedure report has been included in a sealed envelope for you to review at your convenience later.  YOU SHOULD EXPECT: Some feelings of bloating in the abdomen. Passage of more gas than usual.  Walking can help get rid of the air that was put into your GI tract during the procedure and reduce the bloating. If you had a lower endoscopy (such as a colonoscopy or flexible sigmoidoscopy) you may notice spotting of blood in your stool or on the toilet paper. If you underwent a bowel prep for your procedure, you may not have a normal bowel movement for a few days.  Please Note:  You might notice some irritation and congestion in your nose or some drainage.  This is from the oxygen used during your procedure.  There is no need for concern and it should clear up in a day or so.  SYMPTOMS TO REPORT IMMEDIATELY:   Following lower endoscopy (colonoscopy or flexible sigmoidoscopy):  Excessive amounts of blood in the stool  Significant tenderness or worsening of abdominal pains  Swelling of the abdomen that is new, acute  Fever of 100F or higher  For urgent or emergent issues, a gastroenterologist can be reached at any hour by calling [phone].   DIET: Your first meal following the procedure should be a small meal and then it is ok to progress to your normal diet. Heavy or fried foods are harder to digest and may make you feel nauseous or bloated.  Likewise, meals heavy in dairy and vegetables can increase bloating.  Drink plenty of fluids but you should avoid alcoholic beverages for 24  hours.  ACTIVITY:  You should plan to take it easy for the rest of today and you should NOT DRIVE or use heavy machinery until tomorrow (because of the sedation medicines used during the test).    FOLLOW UP: Our staff will call the number listed on your records the next business day following your procedure to check on you and address any questions or concerns that you may have regarding the information given to you following your procedure. If we do not reach you, we will leave a message.  However, if you are feeling well and you are not experiencing any problems, there is no need to return our call.  We will assume that you have returned to your regular daily activities without incident.  If any biopsies were taken you will be contacted by phone or by letter within the next 1-3 weeks.  Please call us at [phone] if you have not heard about the biopsies in 3 weeks.    SIGNATURES/CONFIDENTIALITY: You and/or your care partner have signed paperwork which will be entered into your electronic medical record.  These signatures attest to the fact that that the information above on your After Visit Summary has been reviewed and is understood.  Full responsibility of the confidentiality of this discharge information lies with you and/or your care-partner.  Okay to use dicyclomine if needed for abdominal pain.  Repeat colonoscopy at age 39 for screening.

## 2015-06-29 ENCOUNTER — Telehealth: Payer: Self-pay

## 2015-06-29 NOTE — Telephone Encounter (Signed)
Attempt call back, voice mailbox full, unable to leave message.

## 2016-11-22 DIAGNOSIS — M9902 Segmental and somatic dysfunction of thoracic region: Secondary | ICD-10-CM | POA: Diagnosis not present

## 2016-11-22 DIAGNOSIS — M9901 Segmental and somatic dysfunction of cervical region: Secondary | ICD-10-CM | POA: Diagnosis not present

## 2016-11-22 DIAGNOSIS — M9903 Segmental and somatic dysfunction of lumbar region: Secondary | ICD-10-CM | POA: Diagnosis not present

## 2016-11-26 DIAGNOSIS — M9901 Segmental and somatic dysfunction of cervical region: Secondary | ICD-10-CM | POA: Diagnosis not present

## 2016-11-26 DIAGNOSIS — M9903 Segmental and somatic dysfunction of lumbar region: Secondary | ICD-10-CM | POA: Diagnosis not present

## 2016-11-26 DIAGNOSIS — M9902 Segmental and somatic dysfunction of thoracic region: Secondary | ICD-10-CM | POA: Diagnosis not present

## 2016-11-29 DIAGNOSIS — M9903 Segmental and somatic dysfunction of lumbar region: Secondary | ICD-10-CM | POA: Diagnosis not present

## 2016-11-29 DIAGNOSIS — M9901 Segmental and somatic dysfunction of cervical region: Secondary | ICD-10-CM | POA: Diagnosis not present

## 2016-11-29 DIAGNOSIS — M9902 Segmental and somatic dysfunction of thoracic region: Secondary | ICD-10-CM | POA: Diagnosis not present

## 2016-12-06 DIAGNOSIS — M9902 Segmental and somatic dysfunction of thoracic region: Secondary | ICD-10-CM | POA: Diagnosis not present

## 2016-12-06 DIAGNOSIS — M9901 Segmental and somatic dysfunction of cervical region: Secondary | ICD-10-CM | POA: Diagnosis not present

## 2016-12-06 DIAGNOSIS — M9903 Segmental and somatic dysfunction of lumbar region: Secondary | ICD-10-CM | POA: Diagnosis not present

## 2016-12-10 DIAGNOSIS — M9901 Segmental and somatic dysfunction of cervical region: Secondary | ICD-10-CM | POA: Diagnosis not present

## 2016-12-10 DIAGNOSIS — M9903 Segmental and somatic dysfunction of lumbar region: Secondary | ICD-10-CM | POA: Diagnosis not present

## 2016-12-10 DIAGNOSIS — M9902 Segmental and somatic dysfunction of thoracic region: Secondary | ICD-10-CM | POA: Diagnosis not present

## 2016-12-13 DIAGNOSIS — M9901 Segmental and somatic dysfunction of cervical region: Secondary | ICD-10-CM | POA: Diagnosis not present

## 2016-12-13 DIAGNOSIS — M9903 Segmental and somatic dysfunction of lumbar region: Secondary | ICD-10-CM | POA: Diagnosis not present

## 2016-12-13 DIAGNOSIS — M9902 Segmental and somatic dysfunction of thoracic region: Secondary | ICD-10-CM | POA: Diagnosis not present

## 2016-12-18 DIAGNOSIS — M9903 Segmental and somatic dysfunction of lumbar region: Secondary | ICD-10-CM | POA: Diagnosis not present

## 2016-12-18 DIAGNOSIS — M9902 Segmental and somatic dysfunction of thoracic region: Secondary | ICD-10-CM | POA: Diagnosis not present

## 2016-12-18 DIAGNOSIS — M9901 Segmental and somatic dysfunction of cervical region: Secondary | ICD-10-CM | POA: Diagnosis not present

## 2016-12-20 DIAGNOSIS — M9901 Segmental and somatic dysfunction of cervical region: Secondary | ICD-10-CM | POA: Diagnosis not present

## 2016-12-20 DIAGNOSIS — M9902 Segmental and somatic dysfunction of thoracic region: Secondary | ICD-10-CM | POA: Diagnosis not present

## 2016-12-20 DIAGNOSIS — M9903 Segmental and somatic dysfunction of lumbar region: Secondary | ICD-10-CM | POA: Diagnosis not present

## 2016-12-24 DIAGNOSIS — M9902 Segmental and somatic dysfunction of thoracic region: Secondary | ICD-10-CM | POA: Diagnosis not present

## 2016-12-24 DIAGNOSIS — M9903 Segmental and somatic dysfunction of lumbar region: Secondary | ICD-10-CM | POA: Diagnosis not present

## 2016-12-24 DIAGNOSIS — M9901 Segmental and somatic dysfunction of cervical region: Secondary | ICD-10-CM | POA: Diagnosis not present

## 2016-12-31 DIAGNOSIS — M9902 Segmental and somatic dysfunction of thoracic region: Secondary | ICD-10-CM | POA: Diagnosis not present

## 2016-12-31 DIAGNOSIS — M9903 Segmental and somatic dysfunction of lumbar region: Secondary | ICD-10-CM | POA: Diagnosis not present

## 2016-12-31 DIAGNOSIS — M9901 Segmental and somatic dysfunction of cervical region: Secondary | ICD-10-CM | POA: Diagnosis not present

## 2017-01-07 DIAGNOSIS — M9903 Segmental and somatic dysfunction of lumbar region: Secondary | ICD-10-CM | POA: Diagnosis not present

## 2017-01-07 DIAGNOSIS — M9901 Segmental and somatic dysfunction of cervical region: Secondary | ICD-10-CM | POA: Diagnosis not present

## 2017-01-07 DIAGNOSIS — M9902 Segmental and somatic dysfunction of thoracic region: Secondary | ICD-10-CM | POA: Diagnosis not present

## 2017-01-15 DIAGNOSIS — R21 Rash and other nonspecific skin eruption: Secondary | ICD-10-CM | POA: Diagnosis not present

## 2017-01-16 DIAGNOSIS — M9903 Segmental and somatic dysfunction of lumbar region: Secondary | ICD-10-CM | POA: Diagnosis not present

## 2017-01-16 DIAGNOSIS — M9902 Segmental and somatic dysfunction of thoracic region: Secondary | ICD-10-CM | POA: Diagnosis not present

## 2017-01-16 DIAGNOSIS — M9901 Segmental and somatic dysfunction of cervical region: Secondary | ICD-10-CM | POA: Diagnosis not present

## 2017-01-30 DIAGNOSIS — M9903 Segmental and somatic dysfunction of lumbar region: Secondary | ICD-10-CM | POA: Diagnosis not present

## 2017-01-30 DIAGNOSIS — M9902 Segmental and somatic dysfunction of thoracic region: Secondary | ICD-10-CM | POA: Diagnosis not present

## 2017-01-30 DIAGNOSIS — M9901 Segmental and somatic dysfunction of cervical region: Secondary | ICD-10-CM | POA: Diagnosis not present

## 2017-02-12 DIAGNOSIS — M9902 Segmental and somatic dysfunction of thoracic region: Secondary | ICD-10-CM | POA: Diagnosis not present

## 2017-02-12 DIAGNOSIS — M9903 Segmental and somatic dysfunction of lumbar region: Secondary | ICD-10-CM | POA: Diagnosis not present

## 2017-02-12 DIAGNOSIS — M9901 Segmental and somatic dysfunction of cervical region: Secondary | ICD-10-CM | POA: Diagnosis not present

## 2017-03-05 DIAGNOSIS — M9901 Segmental and somatic dysfunction of cervical region: Secondary | ICD-10-CM | POA: Diagnosis not present

## 2017-03-05 DIAGNOSIS — M9902 Segmental and somatic dysfunction of thoracic region: Secondary | ICD-10-CM | POA: Diagnosis not present

## 2017-03-05 DIAGNOSIS — M9903 Segmental and somatic dysfunction of lumbar region: Secondary | ICD-10-CM | POA: Diagnosis not present

## 2017-04-03 DIAGNOSIS — M9902 Segmental and somatic dysfunction of thoracic region: Secondary | ICD-10-CM | POA: Diagnosis not present

## 2017-04-03 DIAGNOSIS — M9901 Segmental and somatic dysfunction of cervical region: Secondary | ICD-10-CM | POA: Diagnosis not present

## 2017-04-03 DIAGNOSIS — M9903 Segmental and somatic dysfunction of lumbar region: Secondary | ICD-10-CM | POA: Diagnosis not present

## 2017-05-03 DIAGNOSIS — M9902 Segmental and somatic dysfunction of thoracic region: Secondary | ICD-10-CM | POA: Diagnosis not present

## 2017-05-03 DIAGNOSIS — M9903 Segmental and somatic dysfunction of lumbar region: Secondary | ICD-10-CM | POA: Diagnosis not present

## 2017-05-03 DIAGNOSIS — M9901 Segmental and somatic dysfunction of cervical region: Secondary | ICD-10-CM | POA: Diagnosis not present

## 2017-06-03 DIAGNOSIS — M9903 Segmental and somatic dysfunction of lumbar region: Secondary | ICD-10-CM | POA: Diagnosis not present

## 2017-06-03 DIAGNOSIS — M9902 Segmental and somatic dysfunction of thoracic region: Secondary | ICD-10-CM | POA: Diagnosis not present

## 2017-06-03 DIAGNOSIS — M9901 Segmental and somatic dysfunction of cervical region: Secondary | ICD-10-CM | POA: Diagnosis not present

## 2017-06-04 DIAGNOSIS — M9902 Segmental and somatic dysfunction of thoracic region: Secondary | ICD-10-CM | POA: Diagnosis not present

## 2017-06-04 DIAGNOSIS — M9901 Segmental and somatic dysfunction of cervical region: Secondary | ICD-10-CM | POA: Diagnosis not present

## 2017-06-04 DIAGNOSIS — M9903 Segmental and somatic dysfunction of lumbar region: Secondary | ICD-10-CM | POA: Diagnosis not present

## 2017-06-05 DIAGNOSIS — M62838 Other muscle spasm: Secondary | ICD-10-CM | POA: Diagnosis not present

## 2017-06-05 DIAGNOSIS — M9901 Segmental and somatic dysfunction of cervical region: Secondary | ICD-10-CM | POA: Diagnosis not present

## 2017-06-05 DIAGNOSIS — M9902 Segmental and somatic dysfunction of thoracic region: Secondary | ICD-10-CM | POA: Diagnosis not present

## 2017-06-05 DIAGNOSIS — M9903 Segmental and somatic dysfunction of lumbar region: Secondary | ICD-10-CM | POA: Diagnosis not present

## 2017-06-11 DIAGNOSIS — M542 Cervicalgia: Secondary | ICD-10-CM | POA: Diagnosis not present

## 2017-06-11 DIAGNOSIS — M79602 Pain in left arm: Secondary | ICD-10-CM | POA: Diagnosis not present

## 2017-09-25 DIAGNOSIS — J301 Allergic rhinitis due to pollen: Secondary | ICD-10-CM | POA: Diagnosis not present

## 2017-09-25 DIAGNOSIS — J3089 Other allergic rhinitis: Secondary | ICD-10-CM | POA: Diagnosis not present

## 2017-09-25 DIAGNOSIS — J3081 Allergic rhinitis due to animal (cat) (dog) hair and dander: Secondary | ICD-10-CM | POA: Diagnosis not present

## 2018-03-12 DIAGNOSIS — L237 Allergic contact dermatitis due to plants, except food: Secondary | ICD-10-CM | POA: Diagnosis not present

## 2018-03-19 DIAGNOSIS — L259 Unspecified contact dermatitis, unspecified cause: Secondary | ICD-10-CM | POA: Diagnosis not present

## 2018-05-17 ENCOUNTER — Emergency Department (HOSPITAL_BASED_OUTPATIENT_CLINIC_OR_DEPARTMENT_OTHER): Payer: 59

## 2018-05-17 ENCOUNTER — Encounter (HOSPITAL_BASED_OUTPATIENT_CLINIC_OR_DEPARTMENT_OTHER): Payer: Self-pay | Admitting: Emergency Medicine

## 2018-05-17 ENCOUNTER — Emergency Department (HOSPITAL_BASED_OUTPATIENT_CLINIC_OR_DEPARTMENT_OTHER)
Admission: EM | Admit: 2018-05-17 | Discharge: 2018-05-17 | Disposition: A | Discharge status: Home / Self Care | Payer: 59 | Attending: Emergency Medicine | Admitting: Emergency Medicine

## 2018-05-17 ENCOUNTER — Other Ambulatory Visit: Payer: Self-pay

## 2018-05-17 DIAGNOSIS — D729 Disorder of white blood cells, unspecified: Secondary | ICD-10-CM | POA: Diagnosis not present

## 2018-05-17 DIAGNOSIS — K2920 Alcoholic gastritis without bleeding: Secondary | ICD-10-CM | POA: Insufficient documentation

## 2018-05-17 DIAGNOSIS — J45909 Unspecified asthma, uncomplicated: Secondary | ICD-10-CM | POA: Diagnosis not present

## 2018-05-17 DIAGNOSIS — Z3202 Encounter for pregnancy test, result negative: Secondary | ICD-10-CM | POA: Diagnosis not present

## 2018-05-17 DIAGNOSIS — R197 Diarrhea, unspecified: Secondary | ICD-10-CM | POA: Diagnosis not present

## 2018-05-17 DIAGNOSIS — Z79899 Other long term (current) drug therapy: Secondary | ICD-10-CM | POA: Insufficient documentation

## 2018-05-17 DIAGNOSIS — R1110 Vomiting, unspecified: Secondary | ICD-10-CM | POA: Diagnosis not present

## 2018-05-17 DIAGNOSIS — R112 Nausea with vomiting, unspecified: Secondary | ICD-10-CM

## 2018-05-17 DIAGNOSIS — R109 Unspecified abdominal pain: Secondary | ICD-10-CM | POA: Diagnosis not present

## 2018-05-17 DIAGNOSIS — R1084 Generalized abdominal pain: Secondary | ICD-10-CM | POA: Insufficient documentation

## 2018-05-17 DIAGNOSIS — K529 Noninfective gastroenteritis and colitis, unspecified: Secondary | ICD-10-CM | POA: Insufficient documentation

## 2018-05-17 LAB — CBC WITH DIFFERENTIAL/PLATELET
Basophils Absolute: 0 10*3/uL (ref 0.0–0.1)
Basophils Relative: 0 %
Eosinophils Absolute: 0 10*3/uL (ref 0.0–0.7)
Eosinophils Relative: 0 %
HEMATOCRIT: 40.6 % (ref 36.0–46.0)
Hemoglobin: 14.1 g/dL (ref 12.0–15.0)
LYMPHS PCT: 8 %
Lymphs Abs: 1.4 10*3/uL (ref 0.7–4.0)
MCH: 31.5 pg (ref 26.0–34.0)
MCHC: 34.7 g/dL (ref 30.0–36.0)
MCV: 90.6 fL (ref 78.0–100.0)
MONO ABS: 0.9 10*3/uL (ref 0.1–1.0)
MONOS PCT: 5 %
NEUTROS ABS: 15.4 10*3/uL — AB (ref 1.7–7.7)
Neutrophils Relative %: 87 %
Platelets: 362 10*3/uL (ref 150–400)
RBC: 4.48 MIL/uL (ref 3.87–5.11)
RDW: 13.3 % (ref 11.5–15.5)
WBC: 17.7 10*3/uL — ABNORMAL HIGH (ref 4.0–10.5)

## 2018-05-17 LAB — COMPREHENSIVE METABOLIC PANEL
ALT: 16 U/L (ref 0–44)
AST: 19 U/L (ref 15–41)
Albumin: 4.3 g/dL (ref 3.5–5.0)
Alkaline Phosphatase: 72 U/L (ref 38–126)
Anion gap: 13 (ref 5–15)
BUN: 9 mg/dL (ref 6–20)
CO2: 23 mmol/L (ref 22–32)
Calcium: 9.5 mg/dL (ref 8.9–10.3)
Chloride: 106 mmol/L (ref 98–111)
Creatinine, Ser: 0.61 mg/dL (ref 0.44–1.00)
Glucose, Bld: 123 mg/dL — ABNORMAL HIGH (ref 70–99)
Potassium: 3.8 mmol/L (ref 3.5–5.1)
Sodium: 142 mmol/L (ref 135–145)
TOTAL PROTEIN: 7.9 g/dL (ref 6.5–8.1)
Total Bilirubin: 0.4 mg/dL (ref 0.3–1.2)

## 2018-05-17 LAB — URINALYSIS, ROUTINE W REFLEX MICROSCOPIC
Bilirubin Urine: NEGATIVE
GLUCOSE, UA: NEGATIVE mg/dL
HGB URINE DIPSTICK: NEGATIVE
Ketones, ur: 40 mg/dL — AB
Leukocytes, UA: NEGATIVE
Nitrite: NEGATIVE
PROTEIN: NEGATIVE mg/dL
Specific Gravity, Urine: 1.01 (ref 1.005–1.030)
pH: 7.5 (ref 5.0–8.0)

## 2018-05-17 LAB — PREGNANCY, URINE: Preg Test, Ur: NEGATIVE

## 2018-05-17 LAB — LIPASE, BLOOD: Lipase: 29 U/L (ref 11–51)

## 2018-05-17 MED ORDER — SODIUM CHLORIDE 0.9 % IV BOLUS
2000.0000 mL | Freq: Once | INTRAVENOUS | Status: AC
  Administered 2018-05-17: 2000 mL via INTRAVENOUS

## 2018-05-17 MED ORDER — PROMETHAZINE HCL 25 MG RE SUPP: 25.0000 mg | Freq: Four times a day (QID) | RECTAL | 0 refills | Status: DC | PRN

## 2018-05-17 MED ORDER — PROMETHAZINE HCL 25 MG/ML IJ SOLN
25.0000 mg | Freq: Once | INTRAMUSCULAR | Status: AC
Start: 2018-05-17 — End: 2018-05-17
  Administered 2018-05-17: 25 mg via INTRAVENOUS
  Filled 2018-05-17: qty 1

## 2018-05-17 MED ORDER — GI COCKTAIL ~~LOC~~
30.0000 mL | Freq: Once | ORAL | Status: DC
  Filled 2018-05-17: qty 30

## 2018-05-17 MED ORDER — ONDANSETRON 4 MG PO TBDP: 4.0000 mg | ORAL_TABLET | Freq: Three times a day (TID) | ORAL | 0 refills | Status: DC | PRN

## 2018-05-17 MED ORDER — ONDANSETRON HCL 4 MG/2ML IJ SOLN
4.0000 mg | Freq: Once | INTRAMUSCULAR | Status: AC
  Administered 2018-05-17: 4 mg via INTRAVENOUS
  Filled 2018-05-17: qty 2

## 2018-05-17 MED ORDER — MORPHINE SULFATE (PF) 4 MG/ML IV SOLN
4.0000 mg | Freq: Once | INTRAVENOUS | Status: AC
  Administered 2018-05-17: 4 mg via INTRAVENOUS
  Filled 2018-05-17: qty 1

## 2018-05-17 MED ORDER — RANITIDINE HCL 150 MG PO TABS: 150.0000 mg | ORAL_TABLET | Freq: Two times a day (BID) | ORAL | 0 refills | Status: DC

## 2018-05-17 MED ORDER — IOPAMIDOL (ISOVUE-300) INJECTION 61%
100.0000 mL | Freq: Once | INTRAVENOUS | Status: AC | PRN
  Administered 2018-05-17: 100 mL via INTRAVENOUS

## 2018-05-17 MED ORDER — FAMOTIDINE IN NACL 20-0.9 MG/50ML-% IV SOLN
20.0000 mg | Freq: Once | INTRAVENOUS | Status: AC
  Administered 2018-05-17: 20 mg via INTRAVENOUS
  Filled 2018-05-17: qty 50

## 2018-05-17 NOTE — ED Triage Notes (Signed)
Vomiting since last night. Reports drinking too much.

## 2018-05-17 NOTE — ED Notes (Signed)
Per Radiology protocol, CT of abdomen and pelvis will need to wait for urine pregnancy test to result prior to imaging UNLESS pt chooses to sign waiver.

## 2018-05-17 NOTE — ED Notes (Signed)
Patient was asked if she was able to provide urine sample. Patient said she could not at this time.

## 2018-05-17 NOTE — ED Provider Notes (Addendum)
[facility] EMERGENCY DEPARTMENT Provider Note   CSN: [id number] Arrival date & time: 05/17/18  1329     History   Chief Complaint Chief Complaint  Patient presents with  . Emesis    HPI Mariah [name] is a 42 y.o. female with a PMHx of anemia, asthma, colitis, neuropathy, and other conditions listed below, and PSHx of appendectomy, who presents to the ED with complaints of nausea, vomiting, diarrhea, chills, and abdominal pain that began this morning.  Patient states that she drank quite a bit of alcohol last night, and this morning woke up vomiting.  She has had 7-8 episodes of nonbloody nonbilious emesis and 4-5 episodes of nonbloody diarrhea with associated chills.  She describes her pain as 10/10 intermittent sharp epigastric pain that radiates into the right upper back, worse with laying down, and with no treatments tried prior to arrival.  She cannot recall exactly how much alcohol she had, but states that she was taking tequila shots.  Of note, she has had colitis once before remotely, denies being diagnosed with Crohn's or ulcerative colitis, states that it was a one-time thing that she does not have to take any medicines for on an ongoing basis.  Her PCP is Dr. Clovis Riley at [facility].  She denies any recent foreign travel, sick contacts, suspicious food intake, antibiotics, or frequent NSAID use.  She denies fevers, CP, SOB, constipation, obstipation, melena, hematochezia, hematemesis, hematuria, dysuria, vaginal bleeding/discharge, myalgias, arthralgias, numbness, tingling, focal weakness, or any other complaints at this time.   The history is provided by the patient and medical records. No language interpreter was used.  Emesis   Associated symptoms include abdominal pain, chills and diarrhea. Pertinent negatives include no arthralgias, no fever and no myalgias.    Past Medical History:  Diagnosis Date  . Allergy   . Anemia   . Asthma   . Blood clot in vein    right arm - from IV  . Colitis   . Hemorrhoid   . Neuromuscular disorder (HCC)    neuropathy right arm    Patient Active Problem List   Diagnosis Date Noted  . Diarrhea 06/19/2015  . Rectal bleeding 06/19/2015  . Right lower quadrant abdominal pain 06/19/2015    Past Surgical History:  Procedure Laterality Date  . APPENDECTOMY    . KNEE SURGERY Right    tumor removed  . WISDOM TOOTH EXTRACTION       OB History   None      Home Medications    Prior to Admission medications   Medication Sig Start Date End Date Taking? Authorizing Provider  bisacodyl (DULCOLAX) 5 MG EC tablet Take 1 tablet (5 mg total) by mouth 2 (two) times daily. Patient not taking: Reported on 06/28/2015 06/08/15   Patel-Mills, Lorelle Formosa, PA-C  dicyclomine (BENTYL) 10 MG capsule Take 1 capsule (10 mg total) by mouth 2 (two) times daily. 06/10/15   Zehr, Shanda Bumps D, PA-C  lidocaine (XYLOCAINE) 2 % jelly Apply 1 application topically as needed. 06/08/15   Patel-Mills, Lorelle Formosa, PA-C  Multiple Vitamin (MULTIVITAMIN) tablet Take 1 tablet by mouth daily.    [provider]    Family History Family History  Problem Relation Age of Onset  . Gallstones Mother   . Pancreatic cancer Father   . Nephrolithiasis Sister   . Cancer Paternal Grandmother        type unknown  . Colon cancer Paternal Grandmother   . Prostate cancer Maternal Grandfather   .  Esophageal cancer Neg Hx   . Rectal cancer Neg Hx   . Stomach cancer Neg Hx     Social History Social History   Tobacco Use  . Smoking status: Never Smoker  . Smokeless tobacco: Never Used  Substance Use Topics  . Alcohol use: Yes    Alcohol/week: 0.0 oz    Comment: social  . Drug use: No     Allergies   Robitussin (alcohol free) [guaifenesin]   Review of Systems Review of Systems  Constitutional: Positive for chills. Negative for fever.  Respiratory: Negative for shortness of breath.   Cardiovascular: Negative for chest pain.    Gastrointestinal: Positive for abdominal pain, diarrhea, nausea and vomiting. Negative for blood in stool and constipation.  Genitourinary: Negative for dysuria, hematuria, vaginal bleeding and vaginal discharge.  Musculoskeletal: Negative for arthralgias and myalgias.  Skin: Negative for color change.  Allergic/Immunologic: Negative for immunocompromised state.  Neurological: Negative for weakness and numbness.  Psychiatric/Behavioral: Negative for confusion.   All other systems reviewed and are negative for acute change except as noted in the HPI.    Physical Exam Updated Vital Signs BP 111/81 (BP Location: Left Arm)   Pulse 100   Temp 97.8 F (36.6 C) (Oral)   Resp 18   Ht 5\' 2"  (1.575 m)   Wt 66.7 kg (147 lb)   LMP 05/06/2018   SpO2 97%   BMI 26.89 kg/m   Physical Exam  Constitutional: She is oriented to person, place, and time. Vital signs are normal. She appears well-developed and well-nourished.  Non-toxic appearance. No distress.  Afebrile, nontoxic, appears uncomfortable but in NAD  HENT:  Head: Normocephalic and atraumatic.  Mouth/Throat: Oropharynx is clear and moist and mucous membranes are normal.  Eyes: Conjunctivae and EOM are normal. Right eye exhibits no discharge. Left eye exhibits no discharge.  Neck: Normal range of motion. Neck supple.  Cardiovascular: Normal rate, regular rhythm, normal heart sounds and intact distal pulses. Exam reveals no gallop and no friction rub.  No murmur heard. Pulmonary/Chest: Effort normal and breath sounds normal. No respiratory distress. She has no decreased breath sounds. She has no wheezes. She has no rhonchi. She has no rales.  Abdominal: Soft. Normal appearance and bowel sounds are normal. She exhibits no distension. There is generalized tenderness. There is guarding and positive Murphy's sign (pain elicited but still able to mostly fully inspire). There is no rigidity, no rebound, no CVA tenderness and no tenderness at  McBurney's point.  Soft, nondistended, +BS throughout, with diffuse moderate abd TTP most focally in LLQ and across upper abdomen, some voluntary guarding, no rebound/rigidity, equivocal murphy's sign (pain elicited however pt able to mostly fully inspire), neg mcburney's, no CVA TTP   Musculoskeletal: Normal range of motion.  Neurological: She is alert and oriented to person, place, and time. She has normal strength. No sensory deficit.  Skin: Skin is warm, dry and intact. No rash noted.  Psychiatric: She has a normal mood and affect.  Nursing note and vitals reviewed.    ED Treatments / Results  Labs (all labs ordered are listed, but only abnormal results are displayed) Labs Reviewed  COMPREHENSIVE METABOLIC PANEL - Abnormal; Notable for the following components:      Result Value   Glucose, Bld 123 (*)    All other components within normal limits  URINALYSIS, ROUTINE W REFLEX MICROSCOPIC - Abnormal; Notable for the following components:   Color, Urine STRAW (*)    Ketones, ur 40 (*)  All other components within normal limits  CBC WITH DIFFERENTIAL/PLATELET - Abnormal; Notable for the following components:   WBC 17.7 (*)    Neutro Abs 15.4 (*)    All other components within normal limits  LIPASE, BLOOD  PREGNANCY, URINE    EKG None  Radiology Ct Abdomen Pelvis W Contrast  Result Date: 05/17/2018 CLINICAL DATA:  Generalized abdominal pain, nausea, vomiting, diarrhea. Prior appendectomy. EXAM: CT ABDOMEN AND PELVIS WITH CONTRAST TECHNIQUE: Multidetector CT imaging of the abdomen and pelvis was performed using the standard protocol following bolus administration of intravenous contrast. CONTRAST:  100mL ISOVUE-300 IOPAMIDOL (ISOVUE-300) INJECTION 61% COMPARISON:  06/08/2015 CT abdomen/pelvis. FINDINGS: Lower chest: No significant pulmonary nodules or acute consolidative airspace disease. Hepatobiliary: Normal liver size. No liver mass. Normal gallbladder with no radiopaque  cholelithiasis. No biliary ductal dilatation. Pancreas: Normal, with no mass or duct dilation. Spleen: Normal size. No mass. Adrenals/Urinary Tract: Normal adrenals. Normal kidneys with no hydronephrosis and no renal mass. Normal bladder. Stomach/Bowel: Normal non-distended stomach. Normal caliber small bowel with no small bowel wall thickening. Appendectomy. Normal large bowel with no diverticulosis, large bowel wall thickening or pericolonic fat stranding. Vascular/Lymphatic: Normal caliber abdominal aorta. Patent portal, splenic, hepatic and renal veins. No pathologically enlarged lymph nodes in the abdomen or pelvis. Reproductive: Grossly normal uterus. Small ring enhancing 1.3 cm right adnexal lesion is compatible with a corpus luteum (series 2/image 67). No additional adnexal masses. Other: No pneumoperitoneum, ascites or focal fluid collection. Musculoskeletal: No aggressive appearing focal osseous lesions. IMPRESSION: No acute abnormality. No evidence of bowel obstruction or acute bowel inflammation. Electronically Signed   By: Delbert PhenixJason A Poff M.D.   On: 05/17/2018 17:10    Procedures Procedures (including critical care time)  Medications Ordered in ED Medications  gi cocktail (Maalox,Lidocaine,Donnatal) (30 mLs Oral Not Given 05/17/18 2035)  sodium chloride 0.9 % bolus 2,000 mL (0 mLs Intravenous Stopped 05/17/18 1640)  ondansetron (ZOFRAN) injection 4 mg (4 mg Intravenous Given 05/17/18 1528)  famotidine (PEPCID) IVPB 20 mg premix ( Intravenous Stopped 05/17/18 1602)  morphine 4 MG/ML injection 4 mg (4 mg Intravenous Given 05/17/18 1531)  iopamidol (ISOVUE-300) 61 % injection 100 mL (100 mLs Intravenous Contrast Given 05/17/18 1642)  promethazine (PHENERGAN) injection 25 mg (25 mg Intravenous Given 05/17/18 1849)     Initial Impression / Assessment and Plan / ED Course  I have reviewed the triage vital signs and the nursing notes.  Pertinent labs & imaging results that were available during my  care of the patient were reviewed by me and considered in my medical decision making (see chart for details).     42 y.o. female here with n/v/d and abd pain that began this morning, had quite a bit of alcohol last night. Has had colitis in the past once, but denies hx of crohn's/UC. On exam, diffuse moderate abd TTP particularly in the LLQ and upper abd, some voluntary guarding, no rigidity, pain with murphy's sign however pt still able to inspire mostly fully. Hard to tell just based on exam what might be going on, could be colitis vs diverticulitis vs pancreatitis vs gallbladder pathology, etc. Will start with labs and CT abd/pelv, discussed that if we can't get a good view of the gallbladder then will potentially need RUQ U/S but will start with CT first. Will give pain meds, nausea meds, fluids, and pepcid. Will reassess shortly.   6:36 PM CBC w/diff with neutrophilic leukocytosis WBC 17.7, could be from demargination from vomiting in combination  with dehydration. CMP WNL. Lipase WNL. U/A with 40 ketones but otherwise unremarkable. Upreg neg. CT abd/pelv unremarkable without evidence of colitis/diverticulitis or acute findings. Gallbladder visualized without cholelithiasis or biliary ductal dilation.  Pt was feeling better but now feeling nauseated again; pain improved. Repeat abd exam reveals most of the tenderness is epigastric and LLQ, not as much in RUQ, and neg murphy's (pt able to fully inspire with palpation of RUQ). Doubt need for RUQ U/S. Will attempt phenergan and then GI cocktail and see if we can control symptoms. Will reassess shortly.   9:15 PM Pt feeling better and tolerating PO well after phenergan, declined GI cocktail stating she feels better and wants to go home. Overall, symptoms most c/w gastritis from EtOH consumption, vs viral gastroenteritis. Discussed diet/lifestyle modifications for symptoms, will start on zantac, rx zofran and phenergan, advised tylenol and avoidance/sparing  use of NSAIDs only on full stomach, avoidance of EtOH strongly encouraged, discussed other OTC remedies for symptomatic relief, BRAT diet, and f/up with PCP in 5-7 days for recheck of symptoms and ongoing evaluation/management. I explained the diagnosis and have given explicit precautions to return to the ER including for any other new or worsening symptoms. The patient understands and accepts the medical plan as it's been dictated and I have answered their questions. Discharge instructions concerning home care and prescriptions have been given. The patient is STABLE and is discharged to home in good condition.       Final Clinical Impressions(s) / ED Diagnoses   Final diagnoses:  Generalized abdominal pain  Nausea vomiting and diarrhea  Neutrophilic leukocytosis  Acute alcoholic gastritis without hemorrhage  Gastroenteritis    ED Discharge Orders        Ordered    ranitidine (ZANTAC) 150 MG tablet  2 times daily     05/17/18 2114    ondansetron (ZOFRAN ODT) 4 MG disintegrating tablet  Every 8 hours PRN     05/17/18 2114    promethazine (PHENERGAN) 25 MG suppository  Every 6 hours PRN     05/17/18 [address], [place], [place] 05/17/18 2334    Raeford Razor, MD 05/18/18 [phone]

## 2018-05-17 NOTE — ED Notes (Signed)
Patient transported to CT 

## 2018-05-17 NOTE — Discharge Instructions (Addendum)
Your symptoms are likely from alcohol consumption causing gastritis or an ulcer, or could also be from a viral gastroenteritis illness. Use zofran and/or phenergan as prescribed, as needed for nausea. You will need to take zantac as directed, and avoid spicy/fatty/acidic foods, avoid soda/coffee/tea/alcohol. Avoid laying down flat within 30 minutes of eating. Avoid NSAIDs like ibuprofen/aleve/motrin/etc on an empty stomach. Use tylenol as needed for pain. May consider using over the counter tums, maalox, pepto bismol, or other over the counter remedies to help with symptoms. Stay well hydrated with small sips of fluids throughout the day. Follow a BRAT (banana-rice-applesauce-toast) diet as described below for the next 24-48 hours. The 'BRAT' diet is suggested, then progress to diet as tolerated as symptoms abate. Call your regular doctor if bloody stools, persistent diarrhea, vomiting, fever or abdominal pain. Follow up with your regular doctor in 5-7 days for recheck of symptoms. Return to the ER for changes or worsening symptoms.  Abdominal (belly) pain can be caused by many things. Your caregiver performed an examination and possibly ordered blood/urine tests and imaging (CT scan, x-rays, ultrasound). Many cases can be observed and treated at home after initial evaluation in the emergency department. Even though you are being discharged home, abdominal pain can be unpredictable. Therefore, you need a repeated exam if your pain does not resolve, returns, or worsens. Most patients with abdominal pain don't have to be admitted to the hospital or have surgery, but serious problems like appendicitis and gallbladder attacks can start out as nonspecific pain. Many abdominal conditions cannot be diagnosed in one visit, so follow-up evaluations are very important. SEEK IMMEDIATE MEDICAL ATTENTION IF YOU DEVELOP ANY OF THE FOLLOWING SYMPTOMS: The pain does not go away or becomes severe.  A temperature above 101  develops.  Repeated vomiting occurs (multiple episodes).  The pain becomes localized to portions of the abdomen. The right side could possibly be appendicitis. In an adult, the left lower portion of the abdomen could be colitis or diverticulitis.  Blood is being passed in stools or vomit (bright red or black tarry stools).  Return also if you develop chest pain, difficulty breathing, dizziness or fainting, or become confused, poorly responsive, or inconsolable (young children). The constipation stays for more than 4 days.  There is belly (abdominal) or rectal pain.  You do not seem to be getting better.

## 2018-05-17 NOTE — ED Notes (Signed)
ED Provider at bedside. 

## 2018-05-17 NOTE — ED Notes (Signed)
Pt refused GI cocktail, states she is feeling better and would like to go home.

## 2018-06-10 DIAGNOSIS — M5386 Other specified dorsopathies, lumbar region: Secondary | ICD-10-CM | POA: Diagnosis not present

## 2018-06-10 DIAGNOSIS — M9902 Segmental and somatic dysfunction of thoracic region: Secondary | ICD-10-CM | POA: Diagnosis not present

## 2018-06-10 DIAGNOSIS — M9903 Segmental and somatic dysfunction of lumbar region: Secondary | ICD-10-CM | POA: Diagnosis not present

## 2018-08-05 DIAGNOSIS — M62838 Other muscle spasm: Secondary | ICD-10-CM | POA: Diagnosis not present

## 2018-10-08 DIAGNOSIS — J3081 Allergic rhinitis due to animal (cat) (dog) hair and dander: Secondary | ICD-10-CM | POA: Diagnosis not present

## 2018-10-08 DIAGNOSIS — J3089 Other allergic rhinitis: Secondary | ICD-10-CM | POA: Diagnosis not present

## 2018-10-08 DIAGNOSIS — J301 Allergic rhinitis due to pollen: Secondary | ICD-10-CM | POA: Diagnosis not present

## 2019-08-02 ENCOUNTER — Telehealth: Payer: 59 | Admitting: Family

## 2019-08-02 DIAGNOSIS — Z20822 Contact with and (suspected) exposure to covid-19: Secondary | ICD-10-CM

## 2019-08-02 DIAGNOSIS — Z20828 Contact with and (suspected) exposure to other viral communicable diseases: Secondary | ICD-10-CM

## 2019-08-02 MED ORDER — BENZONATATE 100 MG PO CAPS: 100.0000 mg | ORAL_CAPSULE | Freq: Three times a day (TID) | ORAL | 0 refills | Status: DC | PRN

## 2019-08-02 NOTE — Progress Notes (Signed)
E-Visit for Corona Virus Screening   Your current symptoms could be consistent with the coronavirus.  Many health care providers can now test patients at their office but not all are.  [facility] has multiple testing sites. For information on our COVID testing locations and hours go to [email or web address]  Please quarantine yourself while awaiting your test results.  We are enrolling you in our MyChart Home Montioring for COVID19 . Daily you will receive a questionnaire within the MyChart website. Our COVID 19 response team willl be monitoriing your responses daily.  You can go to one of the  testing sites listed below, while they are opened (see hours). You do not need an order and will stay in your car during the test. You do need to self isolate until your results return and if positive 14 days from when your symptoms started and until you are 3 days symptom free.   Testing Locations (Monday - Friday, 8 a.m. - 3:30 p.m.) . [place]: [facility], [address], [place], [place]  . [place]: [facility], [address], [place], [place] (entrance off [address])  . [place]: (Closed each Monday): Testing site relocated to the short stay covered drive at [facility]. (Use the [address] entrance to [facility] next to [facility].)   COVID-19 is a respiratory illness with symptoms that are similar to the flu. Symptoms are typically mild to moderate, but there have been cases of severe illness and death due to the virus. The following symptoms may appear 2-14 days after exposure: . Fever . Cough . Shortness of breath or difficulty breathing . Chills . Repeated shaking with chills . Muscle pain . Headache . Sore throat . New loss of taste or smell . Fatigue . Congestion or runny nose . Nausea or vomiting . Diarrhea  It is vitally important that if you feel that you  have an infection such as this virus or any other virus that you stay home and away from places where you may spread it to others.  You should self-quarantine for 14 days if you have symptoms that could potentially be coronavirus or have been in close contact a with a person diagnosed with COVID-19 within the last 2 weeks. You should avoid contact with people age 65 and older.   You should wear a mask or cloth face covering over your nose and mouth if you must be around other people or animals, including pets (even at home). Try to stay at least 6 feet away from other people. This will protect the people around you.  You can use medication such as A prescription cough medication called Tessalon Perles 100 mg. You may take 1-2 capsules every 8 hours as needed for cough.  You may also take acetaminophen (Tylenol) as needed for fever.   Reduce your risk of any infection by using the same precautions used for avoiding the common cold or flu:  . Wash your hands often with soap and warm water for at least 20 seconds.  If soap and water are not readily available, use an alcohol-based hand sanitizer with at least 60% alcohol.  . If coughing or sneezing, cover your mouth and nose by coughing or sneezing into the elbow areas of your shirt or coat, into a tissue or into your sleeve (not your hands). . Avoid shaking hands with others and consider head nods or verbal greetings only. . Avoid touching   your eyes, nose, or mouth with unwashed hands.  . Avoid close contact with people who are sick. . Avoid places or events with large numbers of people in one location, like concerts or sporting events. . Carefully consider travel plans you have or are making. . If you are planning any travel outside or inside the [place], visit the CDC's Travelers' Health webpage for the latest health notices. . If you have some symptoms but not all symptoms, continue to monitor at home and seek medical attention if your symptoms  worsen. . If you are having a medical emergency, call 911.  HOME CARE . Only take medications as instructed by your medical team. . Drink plenty of fluids and get plenty of rest. . A steam or ultrasonic humidifier can help if you have congestion.   GET HELP RIGHT AWAY IF YOU HAVE EMERGENCY WARNING SIGNS** FOR COVID-19. If you or someone is showing any of these signs seek emergency medical care immediately. Call 911 or proceed to your closest emergency facility if: . You develop worsening high fever. . Trouble breathing . Bluish lips or face . Persistent pain or pressure in the chest . New confusion . Inability to wake or stay awake . You cough up blood. . Your symptoms become more severe  **This list is not all possible symptoms. Contact your medical provider for any symptoms that are sever or concerning to you.   MAKE SURE YOU   Understand these instructions.  Will watch your condition.  Will get help right away if you are not doing well or get worse.  Your e-visit answers were reviewed by a board certified advanced clinical practitioner to complete your personal care plan.  Depending on the condition, your plan could have included both over the counter or prescription medications.  If there is a problem please reply once you have received a response from your provider.  Your safety is important to us.  If you have drug allergies check your prescription carefully.    You can use MyChart to ask questions about today's visit, request a non-urgent call back, or ask for a work or school excuse for 24 hours related to this e-Visit. If it has been greater than 24 hours you will need to follow up with your provider, or enter a new e-Visit to address those concerns. You will get an e-mail in the next two days asking about your experience.  I hope that your e-visit has been valuable and will speed your recovery. Thank you for using e-visits.   Approximately 5 minutes was spent  documenting and reviewing patient's chart.   

## 2019-08-03 ENCOUNTER — Other Ambulatory Visit: Payer: Self-pay

## 2019-08-03 DIAGNOSIS — Z20822 Contact with and (suspected) exposure to covid-19: Secondary | ICD-10-CM

## 2019-08-04 LAB — NOVEL CORONAVIRUS, NAA: SARS-CoV-2, NAA: NOT DETECTED

## 2020-05-20 IMAGING — CT CT ABD-PELV W/ CM
2 of 5 series · 16 of 46 positions shown, 18 images · IV contrast (APPLIED)
Comparison: 06/08/2015 CT abdomen/pelvis.

CLINICAL DATA: Generalized abdominal pain, nausea, vomiting,
diarrhea. Prior appendectomy.

EXAM:
CT ABDOMEN AND PELVIS WITH CONTRAST
TECHNIQUE: Multidetector CT imaging of the abdomen and pelvis was performed
using the standard protocol following bolus administration of
intravenous contrast.
CONTRAST:  100mL [id number] IOPAMIDOL ([id number]-[id number]) INJECTION 61%

[Series 2: axial st · axial · 0.79mm/px · z∈[-404,+16]mm · 13 of 94 slices shown, 15 images]
[im 5/94  soft-tissue]
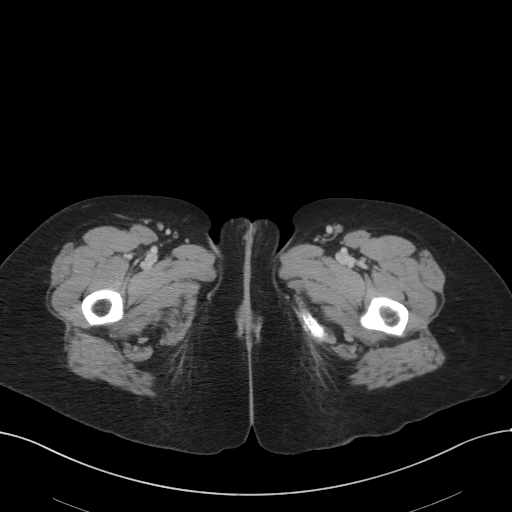
[im 5/94  bone]
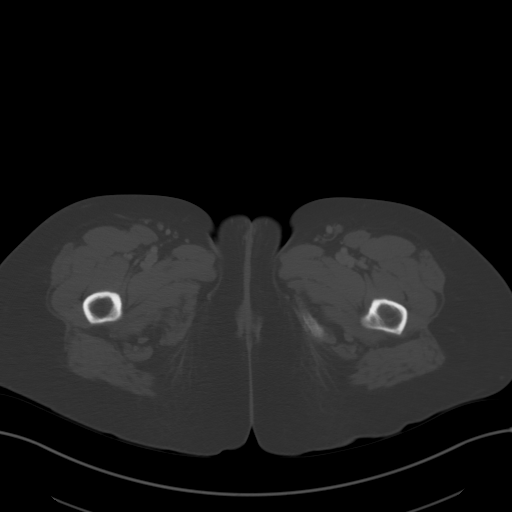
[im 15/94  soft-tissue]
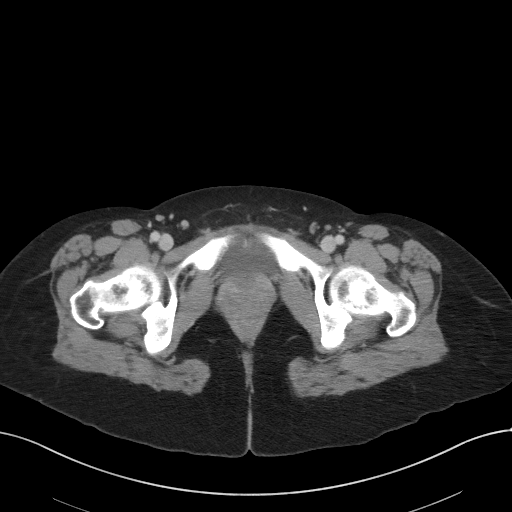
[im 20/94  soft-tissue]
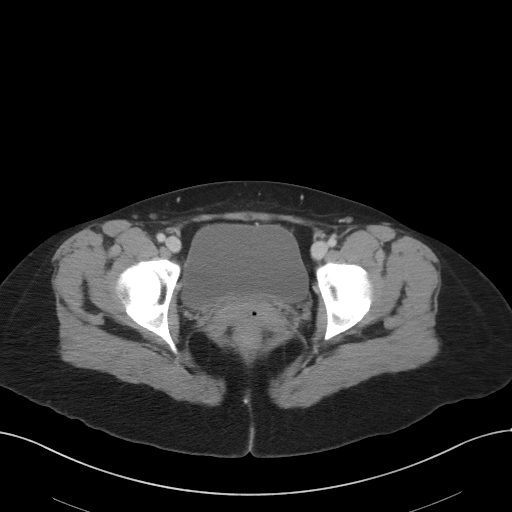
[im 25/94  soft-tissue]
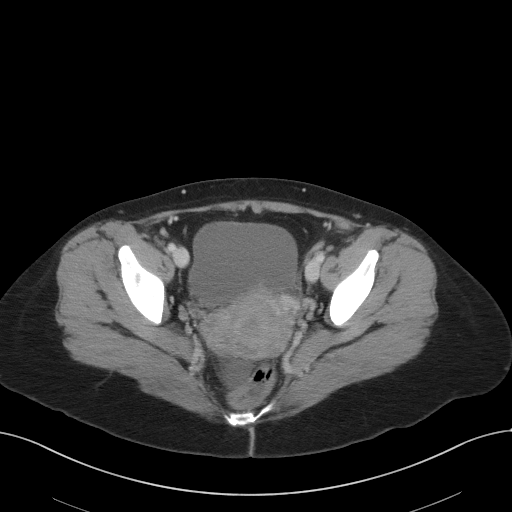
[im 35/94  soft-tissue]
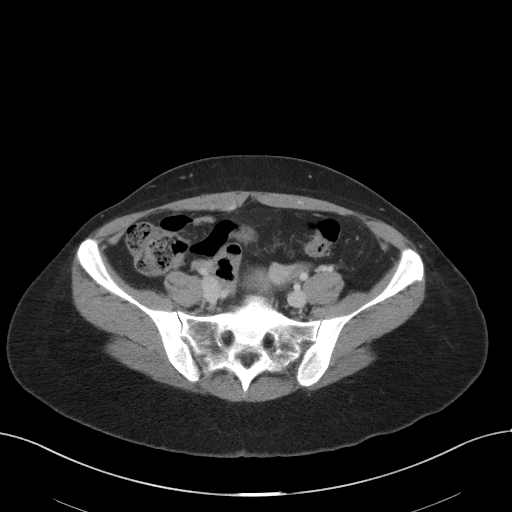
[im 40/94  soft-tissue]
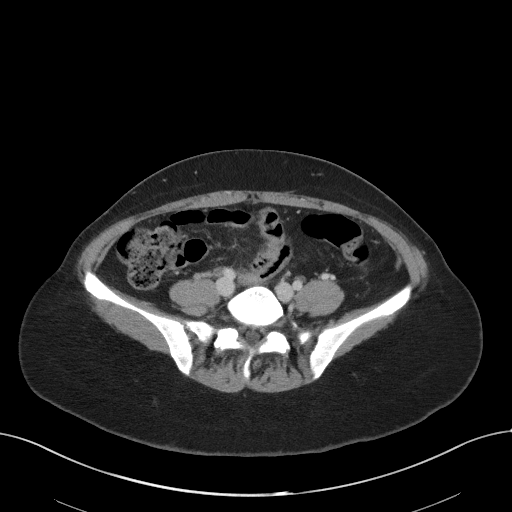
[im 49/94  soft-tissue]
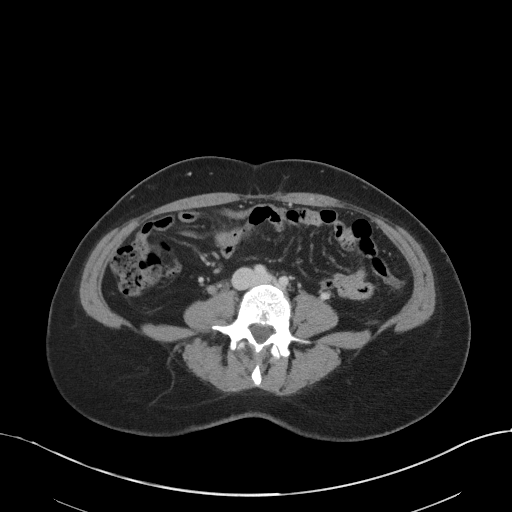
[im 54/94  soft-tissue]
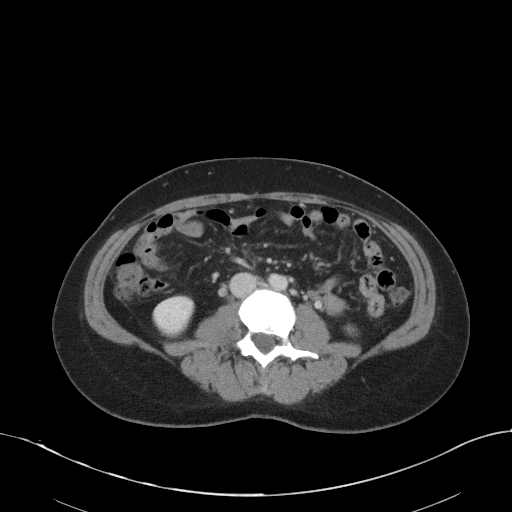
[im 59/94  soft-tissue]
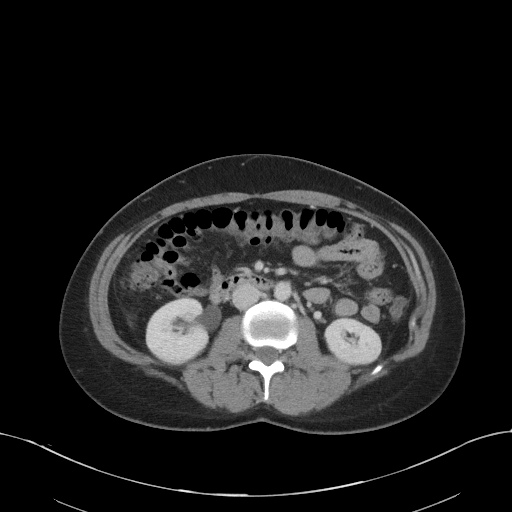
[im 59/94  bone]
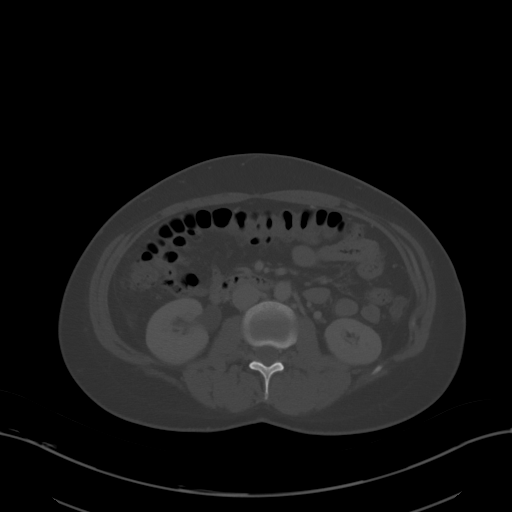
[im 69/94  soft-tissue]
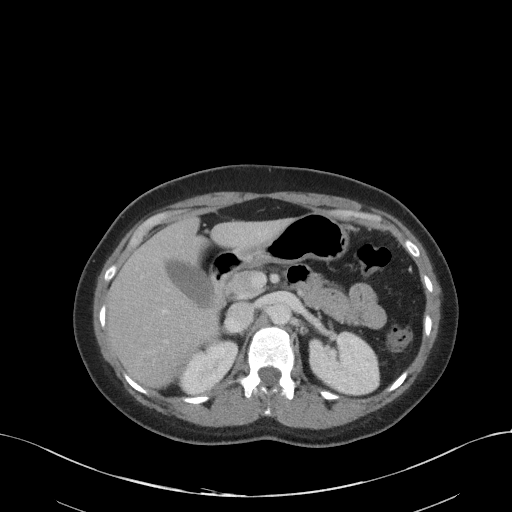
[im 74/94  soft-tissue]
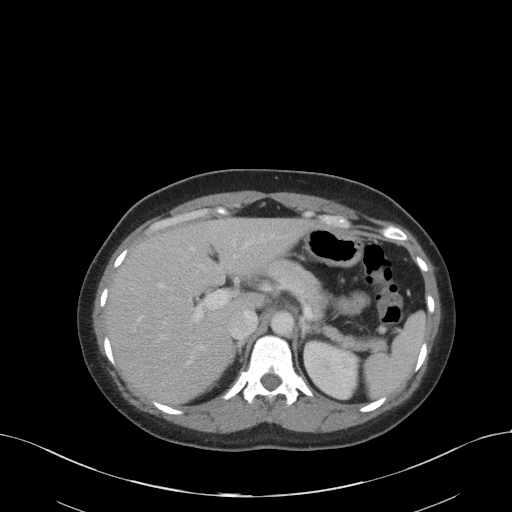
[im 79/94  soft-tissue]
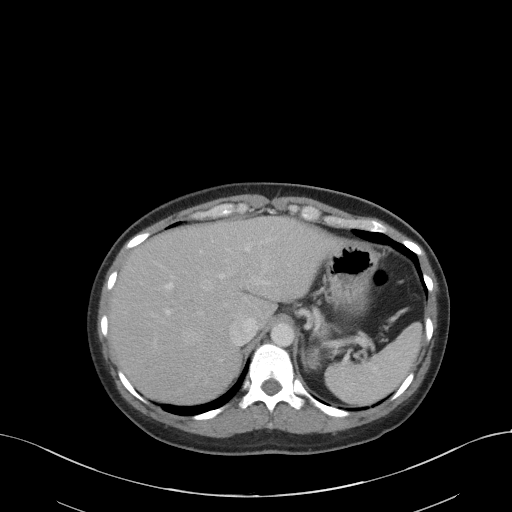
[im 89/94  soft-tissue]
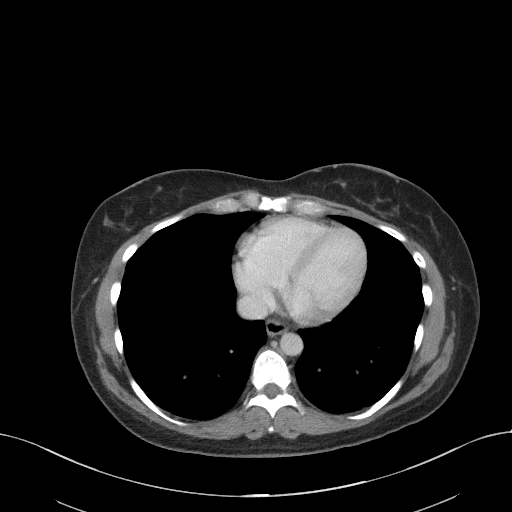

[Series 5: coronal st · coronal · 0.68mm/px · 3 of 96 slices shown]
[im 32/96  soft-tissue]
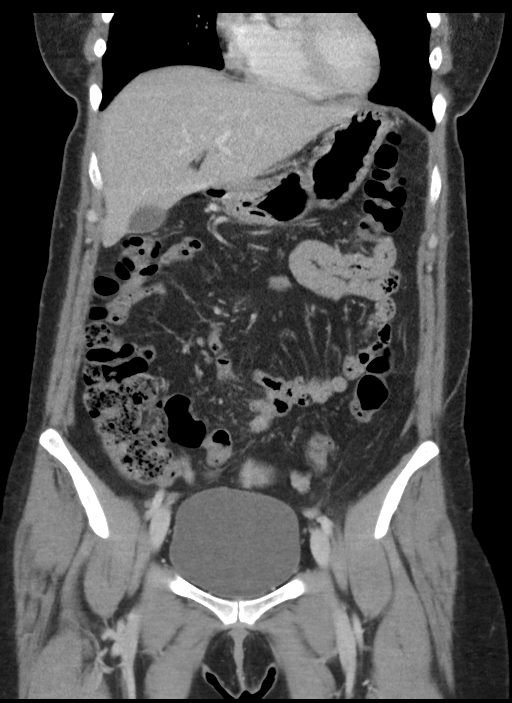
[im 43/96  soft-tissue]
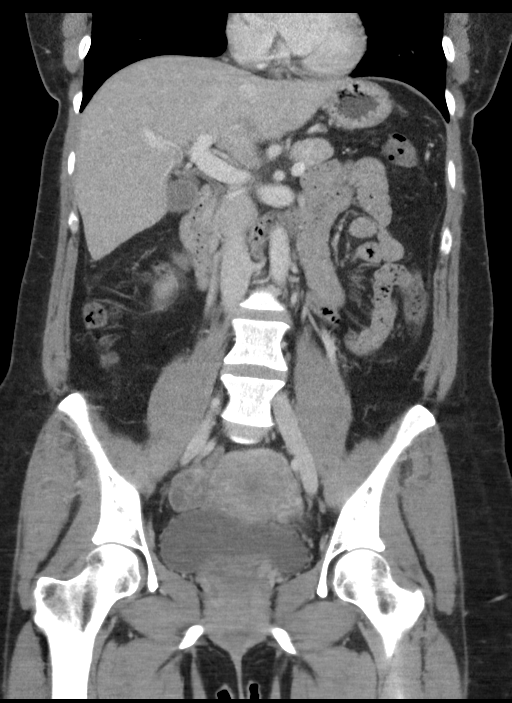
[im 53/96  soft-tissue]
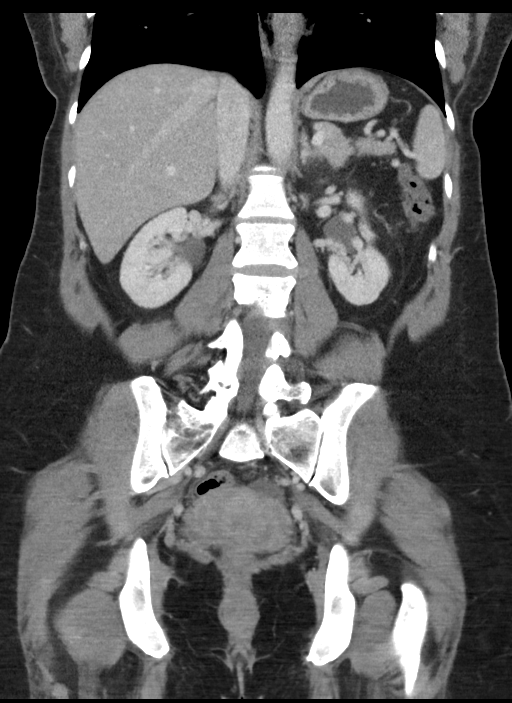

[16 of 46 positions shown; findings below may reference images not displayed]

FINDINGS: Lower chest: No significant pulmonary nodules or acute consolidative
airspace disease.

Hepatobiliary: Normal liver size. No liver mass. Normal gallbladder
with no radiopaque cholelithiasis. No biliary ductal dilatation.

Pancreas: Normal, with no mass or duct dilation.

Spleen: Normal size. No mass.

Adrenals/Urinary Tract: Normal adrenals. Normal kidneys with no
hydronephrosis and no renal mass. Normal bladder.

Stomach/Bowel: Normal non-distended stomach. Normal caliber small
bowel with no small bowel wall thickening. Appendectomy. Normal
large bowel with no diverticulosis, large bowel wall thickening or
pericolonic fat stranding.

Vascular/Lymphatic: Normal caliber abdominal aorta. Patent portal,
splenic, hepatic and renal veins. No pathologically enlarged lymph
nodes in the abdomen or pelvis.

Reproductive: Grossly normal uterus. Small ring enhancing 1.3 cm
right adnexal lesion is compatible with a corpus luteum (series
2/image 67). No additional adnexal masses.

Other: No pneumoperitoneum, ascites or focal fluid collection.

Musculoskeletal: No aggressive appearing focal osseous lesions.
IMPRESSION: No acute abnormality. No evidence of bowel obstruction or acute
bowel inflammation.

## 2020-12-09 ENCOUNTER — Ambulatory Visit: Payer: 59 | Attending: Internal Medicine

## 2020-12-09 ENCOUNTER — Other Ambulatory Visit (HOSPITAL_COMMUNITY): Payer: Self-pay | Admitting: Internal Medicine

## 2020-12-09 DIAGNOSIS — Z23 Encounter for immunization: Secondary | ICD-10-CM

## 2020-12-09 NOTE — Progress Notes (Signed)
   Covid-19 Vaccination Clinic  Name:  Mariah [name]    MRN: [id number] DOB: [birth year]  12/09/2020  Mariah [name] was observed post Covid-19 immunization for 30 minutes based on pre-vaccination screening without incident. She was provided with Vaccine Information Sheet and instruction to access the V-Safe system.   Mariah [name] was instructed to call 911 with any severe reactions post vaccine: Marland Kitchen Difficulty breathing  . Swelling of face and throat  . A fast heartbeat  . A bad rash all over body  . Dizziness and weakness   Immunizations Administered    Name Date Dose VIS Date Route   PFIZER Comrnaty(Gray TOP) Covid-19 Vaccine 12/09/2020 11:55 AM 0.3 mL 09/29/2020 Intramuscular   Manufacturer: [employer], [employer]   Lot: [id number]   NDC: [id number]

## 2020-12-12 MED FILL — PFIZER-BIONT COVID-19 VAC-T: 30 | 21 days supply | Qty: 0 | Fill #0

## 2021-03-03 DIAGNOSIS — M545 Low back pain, unspecified: Secondary | ICD-10-CM | POA: Insufficient documentation

## 2021-03-03 DIAGNOSIS — M5137 Other intervertebral disc degeneration, lumbosacral region: Secondary | ICD-10-CM | POA: Insufficient documentation

## 2021-11-17 ENCOUNTER — Other Ambulatory Visit: Payer: Self-pay

## 2021-11-17 ENCOUNTER — Emergency Department (HOSPITAL_BASED_OUTPATIENT_CLINIC_OR_DEPARTMENT_OTHER)
Admission: EM | Admit: 2021-11-17 | Discharge: 2021-11-17 | Disposition: A | Discharge status: Home / Self Care | Payer: 59 | Attending: Emergency Medicine | Admitting: Emergency Medicine

## 2021-11-17 ENCOUNTER — Encounter (HOSPITAL_BASED_OUTPATIENT_CLINIC_OR_DEPARTMENT_OTHER): Payer: Self-pay

## 2021-11-17 ENCOUNTER — Emergency Department (HOSPITAL_BASED_OUTPATIENT_CLINIC_OR_DEPARTMENT_OTHER): Payer: 59

## 2021-11-17 ENCOUNTER — Other Ambulatory Visit (HOSPITAL_BASED_OUTPATIENT_CLINIC_OR_DEPARTMENT_OTHER): Payer: Self-pay

## 2021-11-17 DIAGNOSIS — N132 Hydronephrosis with renal and ureteral calculous obstruction: Secondary | ICD-10-CM | POA: Diagnosis not present

## 2021-11-17 DIAGNOSIS — D72829 Elevated white blood cell count, unspecified: Secondary | ICD-10-CM | POA: Insufficient documentation

## 2021-11-17 DIAGNOSIS — N9489 Other specified conditions associated with female genital organs and menstrual cycle: Secondary | ICD-10-CM | POA: Insufficient documentation

## 2021-11-17 DIAGNOSIS — Z20822 Contact with and (suspected) exposure to covid-19: Secondary | ICD-10-CM | POA: Diagnosis not present

## 2021-11-17 DIAGNOSIS — J45909 Unspecified asthma, uncomplicated: Secondary | ICD-10-CM | POA: Insufficient documentation

## 2021-11-17 DIAGNOSIS — N201 Calculus of ureter: Secondary | ICD-10-CM

## 2021-11-17 DIAGNOSIS — R1031 Right lower quadrant pain: Secondary | ICD-10-CM | POA: Diagnosis present

## 2021-11-17 LAB — CBC WITH DIFFERENTIAL/PLATELET
Abs Immature Granulocytes: 0.09 10*3/uL — ABNORMAL HIGH (ref 0.00–0.07)
Basophils Absolute: 0 10*3/uL (ref 0.0–0.1)
Basophils Relative: 0 %
Eosinophils Absolute: 0 10*3/uL (ref 0.0–0.5)
Eosinophils Relative: 0 %
HCT: 39.2 % (ref 36.0–46.0)
Hemoglobin: 13.9 g/dL (ref 12.0–15.0)
Immature Granulocytes: 1 %
Lymphocytes Relative: 10 %
Lymphs Abs: 1.7 10*3/uL (ref 0.7–4.0)
MCH: 31.5 pg (ref 26.0–34.0)
MCHC: 35.5 g/dL (ref 30.0–36.0)
MCV: 88.9 fL (ref 80.0–100.0)
Monocytes Absolute: 0.6 10*3/uL (ref 0.1–1.0)
Monocytes Relative: 3 %
Neutro Abs: 15.1 10*3/uL — ABNORMAL HIGH (ref 1.7–7.7)
Neutrophils Relative %: 86 %
Platelets: 336 10*3/uL (ref 150–400)
RBC: 4.41 MIL/uL (ref 3.87–5.11)
RDW: 12.5 % (ref 11.5–15.5)
WBC: 17.6 10*3/uL — ABNORMAL HIGH (ref 4.0–10.5)
nRBC: 0 % (ref 0.0–0.2)

## 2021-11-17 LAB — COMPREHENSIVE METABOLIC PANEL
ALT: 21 U/L (ref 0–44)
AST: 20 U/L (ref 15–41)
Albumin: 4.1 g/dL (ref 3.5–5.0)
Alkaline Phosphatase: 61 U/L (ref 38–126)
Anion gap: 10 (ref 5–15)
BUN: 12 mg/dL (ref 6–20)
CO2: 22 mmol/L (ref 22–32)
Calcium: 9.1 mg/dL (ref 8.9–10.3)
Chloride: 103 mmol/L (ref 98–111)
Creatinine, Ser: 0.68 mg/dL (ref 0.44–1.00)
GFR, Estimated: >60 mL/min (ref >60)
Glucose, Bld: 157 mg/dL — ABNORMAL HIGH (ref 70–99)
Potassium: 3.6 mmol/L (ref 3.5–5.1)
Sodium: 135 mmol/L (ref 135–145)
Total Bilirubin: 0.6 mg/dL (ref 0.3–1.2)
Total Protein: 7.4 g/dL (ref 6.5–8.1)

## 2021-11-17 LAB — URINALYSIS, ROUTINE W REFLEX MICROSCOPIC
Bilirubin Urine: NEGATIVE
Glucose, UA: NEGATIVE mg/dL
Ketones, ur: 80 mg/dL — AB
Leukocytes,Ua: NEGATIVE
Nitrite: NEGATIVE
Protein, ur: NEGATIVE mg/dL
Specific Gravity, Urine: 1.01 (ref 1.005–1.030)
pH: 7 (ref 5.0–8.0)

## 2021-11-17 LAB — LIPASE, BLOOD: Lipase: 27 U/L (ref 11–51)

## 2021-11-17 LAB — HCG, SERUM, QUALITATIVE: Preg, Serum: NEGATIVE

## 2021-11-17 LAB — URINALYSIS, MICROSCOPIC (REFLEX)

## 2021-11-17 LAB — RESP PANEL BY RT-PCR (FLU A&B, COVID) ARPGX2
Influenza A by PCR: NEGATIVE
Influenza B by PCR: NEGATIVE
SARS Coronavirus 2 by RT PCR: NEGATIVE

## 2021-11-17 MED ORDER — IOHEXOL 300 MG/ML  SOLN
100.0000 mL | Freq: Once | INTRAMUSCULAR | Status: AC | PRN
  Administered 2021-11-17: 100 mL via INTRAVENOUS

## 2021-11-17 MED ORDER — SENNOSIDES-DOCUSATE SODIUM 8.6-50 MG PO TABS
1.0000 | ORAL_TABLET | Freq: Every evening | ORAL | 0 refills | Status: DC | PRN
  Filled 2021-11-17: qty 100, 100d supply, fill #0

## 2021-11-17 MED ORDER — ONDANSETRON HCL 4 MG/2ML IJ SOLN
4.0000 mg | Freq: Once | INTRAMUSCULAR | Status: AC
  Administered 2021-11-17: 4 mg via INTRAVENOUS
  Filled 2021-11-17: qty 2

## 2021-11-17 MED ORDER — HYDROMORPHONE HCL 1 MG/ML IJ SOLN
1.0000 mg | Freq: Once | INTRAMUSCULAR | Status: DC
  Filled 2021-11-17: qty 1

## 2021-11-17 MED ORDER — KETOROLAC TROMETHAMINE 30 MG/ML IJ SOLN
30.0000 mg | Freq: Once | INTRAMUSCULAR | Status: AC
  Administered 2021-11-17: 30 mg via INTRAVENOUS
  Filled 2021-11-17: qty 1

## 2021-11-17 MED ORDER — MORPHINE SULFATE (PF) 4 MG/ML IV SOLN
4.0000 mg | Freq: Once | INTRAVENOUS | Status: AC
  Administered 2021-11-17: 4 mg via INTRAVENOUS
  Filled 2021-11-17: qty 1

## 2021-11-17 MED ORDER — IBUPROFEN 800 MG PO TABS
800.0000 mg | ORAL_TABLET | Freq: Three times a day (TID) | ORAL | 0 refills | Status: DC | PRN
  Filled 2021-11-17: qty 21, 7d supply, fill #0

## 2021-11-17 MED ORDER — SODIUM CHLORIDE 0.9 % IV BOLUS
500.0000 mL | Freq: Once | INTRAVENOUS | Status: AC
  Administered 2021-11-17: 500 mL via INTRAVENOUS

## 2021-11-17 MED ORDER — TAMSULOSIN HCL 0.4 MG PO CAPS
0.4000 mg | ORAL_CAPSULE | Freq: Every day | ORAL | 0 refills | Status: AC
  Filled 2021-11-17: qty 14, 14d supply, fill #0

## 2021-11-17 MED ORDER — ONDANSETRON 4 MG PO TBDP
4.0000 mg | ORAL_TABLET | Freq: Three times a day (TID) | ORAL | 0 refills | Status: DC | PRN
  Filled 2021-11-17: qty 20, 7d supply, fill #0

## 2021-11-17 MED ORDER — OXYCODONE-ACETAMINOPHEN 5-325 MG PO TABS
1.0000 | ORAL_TABLET | Freq: Four times a day (QID) | ORAL | 0 refills | Status: DC | PRN
  Filled 2021-11-17: qty 15, 4d supply, fill #0

## 2021-11-17 NOTE — ED Notes (Signed)
Pump taken by biomed and replaced

## 2021-11-17 NOTE — Discharge Instructions (Signed)
You have been seen in the Emergency Department (ED) today for pain that we believe based on your workup, is caused by kidney stones.  As we have discussed, please drink plenty of fluids.  Please make a follow up appointment with the physician(s) listed elsewhere in this documentation. ° °You may take pain medication as needed but ONLY as prescribed.  Please also take your prescribed Flomax daily.  We also recommend that you take over-the-counter ibuprofen regularly according to label instructions over the next 5 days.  Take it with meals to minimize stomach discomfort. ° °Please see your doctor as soon as possible as stones may take 1-3 weeks to pass and you may require additional care or medications. ° °Do not drink alcohol, drive or participate in any other potentially dangerous activities while taking opiate pain medication as it may make you sleepy. Do not take this medication with any other sedating medications, either prescription or over-the-counter. If you were prescribed Percocet or Vicodin, do not take these with acetaminophen (Tylenol) as it is already contained within these medications. °  °Take Percocet as needed for severe pain.  This medication is an opiate (or narcotic) pain medication and can be habit forming.  Use it as little as possible to achieve adequate pain control.  Do not use or use it with extreme caution if you have a history of opiate abuse or dependence.  This medication is intended for your use only - do not give any to anyone else and keep it in a secure place where nobody else, especially children, have access to it.  It will also cause or worsen constipation, so you may want to consider taking an over-the-counter stool softener while you are taking this medication. ° °Return to the Emergency Department (ED) or call your doctor if you have any worsening pain, fever, painful urination, are unable to urinate, or develop other symptoms that concern you. ° ° ° ° °

## 2021-11-17 NOTE — ED Provider Notes (Signed)
Emergency Department Provider Note   I have reviewed the triage vital signs and the nursing notes.   HISTORY  Chief Complaint Abdominal Pain   HPI Mariah [name] is a 46 y.o. female past medical history reviewed below presents to the emergency department for evaluation of acute onset right back pain.  Pain is radiating around to the right and lower abdomen.  She notes a prior history of appendectomy.  No vaginal bleeding or discharge.  No UTI symptoms.  No fevers.  She is having nausea and vomiting as well which did begin today. No respiratory symptoms. Notes that her husband had COVID last week.    Past Medical History:  Diagnosis Date   Allergy    Anemia    Asthma    Blood clot in vein    right arm - from IV   Colitis    Hemorrhoid    Neuromuscular disorder (HCC)    neuropathy right arm    Review of Systems  Constitutional: No fever/chills Eyes: No visual changes. ENT: No sore throat. Cardiovascular: Denies chest pain. Respiratory: Denies shortness of breath. Gastrointestinal: Positive right flank/abdominal pain. Positive nausea, vomiting, and diarrhea.  No constipation. Genitourinary: Negative for dysuria. Musculoskeletal: Negative for back pain. Skin: Negative for rash. Neurological: Negative for headaches, focal weakness or numbness.   ____________________________________________   PHYSICAL EXAM:  VITAL SIGNS: ED Triage Vitals  Enc Vitals Group     BP 11/17/21 1136 (!) 147/76     Pulse Rate 11/17/21 1136 (!) 52     Resp 11/17/21 1136 18     Temp 11/17/21 1136 97.7 F (36.5 C)     Temp Source 11/17/21 1136 Oral     SpO2 11/17/21 1136 100 %     Weight 11/17/21 1135 145 lb (65.8 kg)     Height 11/17/21 1135 5\' 2"  (1.575 m)   Constitutional: Alert and oriented. Well appearing and in no acute distress. Eyes: Conjunctivae are normal. Head: Atraumatic. Nose: No congestion/rhinnorhea. Mouth/Throat: Mucous membranes are moist.  Neck: No stridor.    Cardiovascular: Normal rate, regular rhythm. Good peripheral circulation. Grossly normal heart sounds.   Respiratory: Normal respiratory effort.  No retractions. Lungs CTAB. Gastrointestinal: Soft and nontender. No distention.  Musculoskeletal: No lower extremity tenderness nor edema. No gross deformities of extremities. Neurologic:  Normal speech and language. No gross focal neurologic deficits are appreciated.  Skin:  Skin is warm, dry and intact. No rash noted.  ____________________________________________   LABS (all labs ordered are listed, but only abnormal results are displayed)  Labs Reviewed  URINE CULTURE - Abnormal; Notable for the following components:      Result Value   Culture   (*)    Value: <10,000 COLONIES/mL INSIGNIFICANT GROWTH Performed at [facility], [place] [address], [place], [place] [address]    All other components within normal limits  CBC WITH DIFFERENTIAL/PLATELET - Abnormal; Notable for the following components:   WBC 17.6 (*)    Neutro Abs 15.1 (*)    Abs Immature Granulocytes 0.09 (*)    All other components within normal limits  COMPREHENSIVE METABOLIC PANEL - Abnormal; Notable for the following components:   Glucose, Bld 157 (*)    All other components within normal limits  URINALYSIS, ROUTINE W REFLEX MICROSCOPIC - Abnormal; Notable for the following components:   Hgb urine dipstick TRACE (*)    Ketones, ur 80 (*)    All other components within normal limits  URINALYSIS, MICROSCOPIC (REFLEX) -  Abnormal; Notable for the following components:   Bacteria, UA FEW (*)    All other components within normal limits  RESP PANEL BY RT-PCR (FLU A&B, COVID) ARPGX2  LIPASE, BLOOD  HCG, SERUM, QUALITATIVE     ____________________________________________   PROCEDURES  Procedure(s) performed:   Procedures  None ____________________________________________   INITIAL IMPRESSION / ASSESSMENT AND PLAN / ED COURSE  Pertinent labs & imaging  results that were available during my care of the patient were reviewed by me and considered in my medical decision making (see chart for details).   This patient is Presenting for Evaluation of abdominal pain, which does require a range of treatment options, and is a complaint that involves a high risk of morbidity and mortality.  The Differential Diagnoses includes but is not exclusive to ectopic pregnancy, ovarian cyst, ovarian torsion, acute appendicitis, urinary tract infection, endometriosis, bowel obstruction, hernia, colitis, renal colic, gastroenteritis, volvulus etc.   Critical Interventions- IV pain and nausea medication   Medications  morphine 4 MG/ML injection 4 mg (4 mg Intravenous Given 11/17/21 1304)  ondansetron (ZOFRAN) injection 4 mg (4 mg Intravenous Given 11/17/21 1305)  sodium chloride 0.9 % bolus 500 mL (0 mLs Intravenous Stopped 11/17/21 1548)  iohexol (OMNIPAQUE) 300 MG/ML solution 100 mL (100 mLs Intravenous Contrast Given 11/17/21 1353)  ketorolac (TORADOL) 30 MG/ML injection 30 mg (30 mg Intravenous Given 11/17/21 1546)    Reassessment after intervention:  patient looking well on re-evaluation. Vitals are WNl.    I decided to review pertinent External Data, and in summary no recent ED visits for similar.   Clinical Laboratory Tests Ordered, included CBC with leukocytosis. Negative pregnancy. Creatinine is negative. No electrolyte abnormality. Lipase negative.   Radiologic Tests Ordered, included CT abdomen/pelvis. I independently interpreted the images and agree with radiology interpretation.   Cardiac Monitor Tracing which shows NSR.   Social Determinants of Health Risk patient is a non-smoker.    Reevaluation with update and discussion with patient.  On reevaluation the patient's pain is completely resolved after second dose of pain medication.  Her CT scan was discussed with her including severe right hydro and 4 mm stone at the UVJ.  No other acute findings  on CT. UA pending.   Disposition: discharge   ____________________________________________  FINAL CLINICAL IMPRESSION(S) / ED DIAGNOSES  Final diagnoses:  Ureteral stone     NEW OUTPATIENT MEDICATIONS STARTED DURING THIS VISIT:  Discharge Medication List as of 11/17/2021  3:24 PM     START taking these medications   Details  ibuprofen (ADVIL) 800 MG tablet Take 1 tablet (800 mg total) by mouth every 8 (eight) hours as needed for moderate pain or cramping., Starting Fri 11/17/2021, Normal    oxyCODONE-acetaminophen (PERCOCET/ROXICET) 5-325 MG tablet Take 1 tablet by mouth every 6 (six) hours as needed for severe pain., Starting Fri 11/17/2021, Normal    senna-docusate (SENOKOT-S) 8.6-50 MG tablet Take 1 tablet by mouth at bedtime as needed for mild constipation or moderate constipation., Starting Fri 11/17/2021, Normal    tamsulosin (FLOMAX) 0.4 MG CAPS capsule Take 1 capsule (0.4 mg total) by mouth daily for 14 days., Starting Fri 11/17/2021, Until Fri 12/01/2021, Normal        Note:  This document was prepared using Dragon voice recognition software and may include unintentional dictation errors.  Nanda Quinton, MD, [facility] Emergency Medicine    Lauraann Missey, [facility], MD 11/23/21 [phone]

## 2021-11-17 NOTE — ED Triage Notes (Signed)
Pt arrives with spouse who reports patient started having abdominal pain with NVD starting today. Pt reports her husband had covid last week.

## 2021-11-17 NOTE — ED Notes (Signed)
Pt ambulated to RR steady gate, declined pain medication at this time.

## 2021-11-18 LAB — URINE CULTURE: Culture: <10000 — AB

## 2023-02-27 DIAGNOSIS — J302 Other seasonal allergic rhinitis: Secondary | ICD-10-CM | POA: Insufficient documentation

## 2023-02-28 ENCOUNTER — Other Ambulatory Visit (HOSPITAL_BASED_OUTPATIENT_CLINIC_OR_DEPARTMENT_OTHER): Payer: Self-pay | Admitting: Obstetrics and Gynecology

## 2023-02-28 DIAGNOSIS — R011 Cardiac murmur, unspecified: Secondary | ICD-10-CM

## 2023-03-04 ENCOUNTER — Other Ambulatory Visit: Payer: Self-pay | Admitting: Obstetrics and Gynecology

## 2023-03-04 DIAGNOSIS — R928 Other abnormal and inconclusive findings on diagnostic imaging of breast: Secondary | ICD-10-CM

## 2023-03-14 ENCOUNTER — Ambulatory Visit: Payer: 59

## 2023-03-14 ENCOUNTER — Ambulatory Visit
Admission: RE | Admit: 2023-03-14 | Discharge: 2023-03-14 | Disposition: A | Discharge status: Home / Self Care | Payer: 59 | Source: Ambulatory Visit | Attending: Obstetrics and Gynecology | Admitting: Obstetrics and Gynecology

## 2023-03-14 DIAGNOSIS — R928 Other abnormal and inconclusive findings on diagnostic imaging of breast: Secondary | ICD-10-CM

## 2023-03-28 ENCOUNTER — Ambulatory Visit (HOSPITAL_BASED_OUTPATIENT_CLINIC_OR_DEPARTMENT_OTHER)
Admission: RE | Admit: 2023-03-28 | Discharge: 2023-03-28 | Disposition: A | Discharge status: Home / Self Care | Payer: 59 | Source: Ambulatory Visit | Attending: Obstetrics and Gynecology | Admitting: Obstetrics and Gynecology

## 2023-03-28 DIAGNOSIS — R011 Cardiac murmur, unspecified: Secondary | ICD-10-CM | POA: Diagnosis present

## 2023-03-28 LAB — ECHOCARDIOGRAM COMPLETE
Area-P 1/2: 3.83 cm2
MV M vel: 5.08 m/s
MV Peak grad: 103.2 mmHg
Radius: 0.2 cm
S' Lateral: 2.7 cm

## 2023-06-25 ENCOUNTER — Ambulatory Visit: Payer: 59 | Admitting: Family Medicine

## 2023-06-25 ENCOUNTER — Encounter: Payer: Self-pay | Admitting: Family Medicine

## 2023-06-25 VITALS — BP 110/68 | HR 64 | Temp 97.7°F | Resp 12 | Ht 62.0 in | Wt 160.4 lb

## 2023-06-25 DIAGNOSIS — R7303 Prediabetes: Secondary | ICD-10-CM | POA: Diagnosis not present

## 2023-06-25 DIAGNOSIS — E785 Hyperlipidemia, unspecified: Secondary | ICD-10-CM

## 2023-06-25 DIAGNOSIS — J45991 Cough variant asthma: Secondary | ICD-10-CM | POA: Diagnosis not present

## 2023-06-25 DIAGNOSIS — Z23 Encounter for immunization: Secondary | ICD-10-CM

## 2023-06-25 DIAGNOSIS — I340 Nonrheumatic mitral (valve) insufficiency: Secondary | ICD-10-CM

## 2023-06-25 NOTE — Assessment & Plan Note (Signed)
HgA1C was 6.1 in 03/2023. Consistency with a healthy life style encouraged for diabetes prevention. HgA1C will be added to next labs.

## 2023-06-25 NOTE — Progress Notes (Signed)
HPI: [name] is a 47 y.o. female, who is here today to establish care.  Former PCP: Dr Clovis Riley Last preventive routine visit: Gyn exam in 02/2023.  She reports hx of prediabetes, hyperlipidemia, chronic back pain, seasonal allergies, asthma, and migraines.    04/10/23: TC 235,TG 96, LDL 164,and HDL 54.  Prediabetes: HgA1C 6.1 in 03/2023. Negative for polydipsia,polyuria, or polyphagia.  She sees a gynecologist regularly and had a mammogram in May-2024.  Postmenopausal: She is currently taking estradiol patches and progesterone.  Chronic back pain, she visits a chiropractor every 5-6 weeks.   Asthma: She uses albuterol inhaler, mostly in winter and spring, especially at night.  Follows with immunologist.  She mentions hx of "fainting spells" in high school managed by carrying sugar packets, but none recently unless she goes many hours without eating.  She experiences migraines 2-3 times yearly with associated nausea, vomiting, photophobia and phonophobia. She does not recall headache localization. Cold mask that covers her eye + rest help.  She had a blood transfusion after a miscarriage in [place] between 2000-2001.  She had a colonoscopy in September 2016 due to rectal bleeding and has a family history of colon cancer in her paternal grandmother.  She denies regular alcohol use and has never smoked.  She does not exercise regularly.   In general she follows a healthful diet.  She is inquiring about multivitamins, fish oil, and OTC supplements she needs to be taking.  Heart murmur noted by her gynecologist, echo done on 03/28/23 showed  LVEF 60-65%, normal diastolic function, and mild mitral valve regurgitation. Normal pulmonary artery systolic pressure.  Review of Systems  Constitutional:  Negative for activity change, appetite change and fever.  HENT:  Negative for mouth sores, nosebleeds and trouble swallowing.   Eyes:  Negative for redness and visual  disturbance.  Respiratory:  Negative for cough, shortness of breath and wheezing.   Cardiovascular:  Negative for chest pain, palpitations and leg swelling.  Gastrointestinal:  Negative for abdominal pain, nausea and vomiting.       Negative for changes in bowel habits.  Endocrine: Negative for cold intolerance and heat intolerance.  Genitourinary:  Negative for decreased urine volume, dysuria and hematuria.  Musculoskeletal:  Positive for back pain. Negative for gait problem.  Skin:  Negative for rash.  Allergic/Immunologic: Positive for environmental allergies.  Neurological:  Negative for seizures, syncope, weakness, numbness and headaches.  Psychiatric/Behavioral:  Negative for behavioral problems and confusion.   See other pertinent positives and negatives in HPI.  Current Outpatient Medications on File Prior to Visit  Medication Sig Dispense Refill   estradiol (VIVELLE-DOT) 0.05 MG/24HR patch Apply 1 patch twice a week by transdermal route for 30 days.     gabapentin (NEURONTIN) 300 MG capsule Take by mouth.     lidocaine (XYLOCAINE) 2 % jelly Apply 1 application topically as needed. 30 mL 0   montelukast (SINGULAIR) 10 MG tablet TAKE 1 TABLET BY MOUTH EVERY EVENING     Multiple Vitamin (MULTIVITAMIN) tablet Take 1 tablet by mouth daily.     progesterone (PROMETRIUM) 100 MG capsule Take 1 capsule every day by oral route at bedtime for 30 days.     albuterol (VENTOLIN HFA) 108 (90 Base) MCG/ACT inhaler      EPINEPHrine 0.3 mg/0.3 mL IJ SOAJ injection      fexofenadine (ALLEGRA ALLERGY) 180 MG tablet 1 tablet as needed Orally Once a day     fluticasone (FLONASE) 50 MCG/ACT nasal spray  Multiple Vitamins-Minerals (MULTIPLE VITAMINS/WOMENS PO)      No current facility-administered medications on file prior to visit.   Past Medical History:  Diagnosis Date   Allergy    Anemia    Asthma    Blood clot in vein    right arm - from IV   Colitis    Hemorrhoid    Neuromuscular  disorder (HCC)    neuropathy right arm   Allergies  Allergen Reactions   Molds & Smuts Cough   Other Swelling   Prunus Persica Anaphylaxis   Robitussin (Alcohol Free) [Guaifenesin] Swelling    Family History  Problem Relation Age of Onset   Gallstones Mother    Pancreatic cancer Father    Nephrolithiasis Sister    Cancer Paternal Grandmother        type unknown   Colon cancer Paternal Grandmother    Prostate cancer Maternal Grandfather    Esophageal cancer Neg Hx    Rectal cancer Neg Hx    Stomach cancer Neg Hx     Social History   Socioeconomic History   Marital status: Married    Spouse name: Not on file   Number of children: 2   Years of education: Not on file   Highest education level: Not on file  Occupational History   Occupation: stay at home mom  Tobacco Use   Smoking status: Never   Smokeless tobacco: Never  Vaping Use   Vaping status: Never Used  Substance and Sexual Activity   Alcohol use: Yes    Alcohol/week: 0.0 standard drinks of alcohol    Comment: social   Drug use: No   Sexual activity: Yes    Birth control/protection: None  Other Topics Concern   Not on file  Social History Narrative   Not on file   Social Determinants of Health   Financial Resource Strain: Not on file  Food Insecurity: Not on file  Transportation Needs: Not on file  Physical Activity: Not on file  Stress: Not on file  Social Connections: Not on file   Vitals:   06/25/23 0925  BP: 110/68  Pulse: 64  Resp: 12  Temp: 97.7 F (36.5 C)  SpO2: 98%   Body mass index is 29.34 kg/m.  Physical Exam Vitals and nursing note reviewed.  Constitutional:      General: She is not in acute distress.    Appearance: She is well-developed.  HENT:     Head: Normocephalic and atraumatic.     Mouth/Throat:     Mouth: Mucous membranes are moist.     Pharynx: Oropharynx is clear.  Eyes:     Conjunctiva/sclera: Conjunctivae normal.  Cardiovascular:     Rate and Rhythm:  Normal rate and regular rhythm.     Pulses:          Dorsalis pedis pulses are 2+ on the right side and 2+ on the left side.     Heart sounds: Murmur (soft SEM LUSB) heard.  Pulmonary:     Effort: Pulmonary effort is normal. No respiratory distress.     Breath sounds: Normal breath sounds.  Abdominal:     Palpations: Abdomen is soft. There is no hepatomegaly or mass.     Tenderness: There is no abdominal tenderness.  Lymphadenopathy:     Cervical: No cervical adenopathy.  Skin:    General: Skin is warm.     Findings: No erythema or rash.  Neurological:     General: No focal deficit present.  Mental Status: She is alert and oriented to person, place, and time.     Cranial Nerves: No cranial nerve deficit.     Gait: Gait normal.  Psychiatric:        Mood and Affect: Mood and affect normal.   ASSESSMENT AND PLAN:  Ms. Mariah [name] was seen today for new patient (initial visit).  Diagnoses and all orders for this visit:  Prediabetes Assessment & Plan: HgA1C was 6.1 in 03/2023. Consistency with a healthy life style encouraged for diabetes prevention. HgA1C will be added to next labs.  Orders: -     Hemoglobin A1c; Future  Hyperlipidemia, unspecified hyperlipidemia type Assessment & Plan: LDL on 04/10/23 was 164. Non-HDL 181. We discussed current recommendations in regard to pharmacologic treatment. Non pharmacologic treatment recommended for now. FLP in 3-4 months. Further recommendations will be given according to 10 years CVD risk score and lipid panel numbers.  Orders: -     Lipid panel; Future  Need for Tdap vaccination -     Tdap vaccine greater than or equal to 7yo IM  Cough variant asthma Assessment & Plan: Problem is well controlled. Recommend taking Singulair 10 mg during Spring and Winter. Some side effects discussed. Continue Albuterol inh 2 puff qid prn. Follows with immunologist, Dr Barnetta Chapel.   Mild mitral regurgitation Assessment & Plan: Last  echo in 03/2023. Mild MR. Asymptomatic. Echo q 3-5 years.   Return in about 1 year (around 06/24/2024) for CPE. Fasting labs in 08/2023.Marland Kitchen  Mariah [name] G. [place], MD  [facility]. [place] [facility].

## 2023-06-25 NOTE — Assessment & Plan Note (Addendum)
Problem is well controlled. Recommend taking Singulair 10 mg during Spring and Winter. Some side effects discussed. Continue Albuterol inh 2 puff qid prn. Follows with immunologist, Dr Barnetta Chapel.

## 2023-06-25 NOTE — Assessment & Plan Note (Addendum)
Last echo in 03/2023. Mild MR. Asymptomatic. Echo q 3-5 years.

## 2023-06-25 NOTE — Patient Instructions (Addendum)
A few things to remember from today's visit:  Hyperlipidemia, unspecified hyperlipidemia type  Prediabetes  Need for Tdap vaccination - Plan: Tdap vaccine greater than or equal to 46yo IM  Magnesium Oxide 250 mg diario.  Lab en Nov or diciembre 2024.  Do not use My Chart to request refills or for acute issues that need immediate attention. If you send a my chart message, it may take a few days to be addressed, specially if I am not in the office.  Please be sure medication list is accurate. If a new problem present, please set up appointment sooner than planned today.

## 2023-06-25 NOTE — Assessment & Plan Note (Signed)
LDL on 04/10/23 was 164. Non-HDL 181. We discussed current recommendations in regard to pharmacologic treatment. Non pharmacologic treatment recommended for now. FLP in 3-4 months. Further recommendations will be given according to 10 years CVD risk score and lipid panel numbers.

## 2023-07-31 ENCOUNTER — Ambulatory Visit (INDEPENDENT_AMBULATORY_CARE_PROVIDER_SITE_OTHER): Payer: 59 | Admitting: Internal Medicine

## 2023-07-31 ENCOUNTER — Encounter: Payer: Self-pay | Admitting: Internal Medicine

## 2023-07-31 VITALS — BP 104/70 | HR 73 | Ht 62.0 in | Wt 161.0 lb

## 2023-07-31 DIAGNOSIS — R1013 Epigastric pain: Secondary | ICD-10-CM

## 2023-07-31 DIAGNOSIS — K625 Hemorrhage of anus and rectum: Secondary | ICD-10-CM | POA: Diagnosis not present

## 2023-07-31 DIAGNOSIS — K5901 Slow transit constipation: Secondary | ICD-10-CM | POA: Diagnosis not present

## 2023-07-31 DIAGNOSIS — R140 Abdominal distension (gaseous): Secondary | ICD-10-CM | POA: Diagnosis not present

## 2023-07-31 DIAGNOSIS — R194 Change in bowel habit: Secondary | ICD-10-CM

## 2023-07-31 DIAGNOSIS — Z800 Family history of malignant neoplasm of digestive organs: Secondary | ICD-10-CM

## 2023-07-31 MED ORDER — PLENVU 140 G PO SOLR: 1.0000 | Freq: Once | ORAL | 0 refills | Status: AC

## 2023-07-31 NOTE — Patient Instructions (Addendum)
You have been scheduled for a colonoscopy. Please follow written instructions given to you at your visit today.   Please pick up your prep supplies at the pharmacy within the next 1-3 days.  If you use inhalers (even only as needed), please bring them with you on the day of your procedure.  DO NOT TAKE 7 DAYS PRIOR TO TEST- Trulicity (dulaglutide) Ozempic, Wegovy (semaglutide) Mounjaro (tirzepatide) Bydureon Bcise (exanatide extended release)  DO NOT TAKE 1 DAY PRIOR TO YOUR TEST Rybelsus (semaglutide) Adlyxin (lixisenatide) Victoza (liraglutide) Byetta (exanatide) _______________________________________________________________  _______________________________________________________  If your blood pressure at your visit was 140/90 or greater, please contact your primary care physician to follow up on this.  _______________________________________________________  If you are age 66 or older, your body mass index should be between 23-30. Your Body mass index is 29.45 kg/m. If this is out of the aforementioned range listed, please consider follow up with your Primary Care Provider.  If you are age 43 or younger, your body mass index should be between 19-25. Your Body mass index is 29.45 kg/m. If this is out of the aformentioned range listed, please consider follow up with your Primary Care Provider.   ________________________________________________________  The [facility] providers would like to encourage you to use [facility] to communicate with providers for non-urgent requests or questions.  Due to long hold times on the telephone, sending your provider a message by [facility] may be a faster and more efficient way to get a response.  Please allow 48 business hours for a response.  Please remember that this is for non-urgent requests.  _______________________________________________________

## 2023-07-31 NOTE — Progress Notes (Signed)
HISTORY OF PRESENT ILLNESS:  Mariah [name] is a 47 y.o. 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 Arelia Sneddon regarding change in bowel habits, rectal bleeding, and family history of gastrointestinal cancers.  First, the patient tells me that she has noted change in her stool caliber.  More thin.  She has had longstanding issues with constipation.  She does not take regular agents.  Typically has a daily bowel movement.  She has been increasing her water consumption and exercise.  This helps some.  She is also noticed some significant abdominal bloating.  As well, rectal bleeding with blood in the toilet bowl infrequently.  Had clots on 1 occasion.  She does not think this is hemorrhoids.  Previous colonoscopy September 2016 was normal including intubation of the terminal ileum.  Next, she reports a family history of gastric cancer in her father, who succumbed to the disease at age 68.  She is concerned.  She is interested in screening and wants to be sure that her abdominal complaints are unrelated.  CT of the abdomen January 2023 revealed right-sided kidney stone with obstruction   REVIEW OF SYSTEMS:  All non-GI ROS negative unless otherwise stated in the HPI except for sinus and allergy trouble, anxiety, back pain, breast changes, visual changes, headaches, heart murmur, night sweats, nosebleeds, shortness of breath, sleeping problems  Past Medical History:  Diagnosis Date   Allergy    Anemia    Asthma    Blood clot in vein    right arm - from IV   Colitis    Hemorrhoid    Neuromuscular disorder (HCC)    neuropathy right arm    Past Surgical History:  Procedure Laterality Date   APPENDECTOMY     KNEE SURGERY Right    tumor removed   WISDOM TOOTH EXTRACTION      Social History Mariah [name]  reports that she has never smoked. She has never used smokeless tobacco. She reports current alcohol use. She reports that she does  not use drugs.  family history includes Cancer in her paternal grandmother; Colon cancer in her paternal grandmother; Gallstones in her mother; Nephrolithiasis in her sister; Pancreatic cancer in her father; Prostate cancer in her maternal grandfather.  Allergies  Allergen Reactions   Molds & Smuts Cough   Other Swelling   Prunus Persica Anaphylaxis   Robitussin (Alcohol Free) [Guaifenesin] Swelling       PHYSICAL EXAMINATION: Vital signs: BP 104/70   Pulse 73   Ht 5\' 2"  (1.575 m)   Wt 161 lb (73 kg)   LMP 06/15/2023   BMI 29.45 kg/m   Constitutional: generally well-appearing, no acute distress Psychiatric: alert and oriented x3, cooperative Eyes: extraocular movements intact, anicteric, conjunctiva pink Mouth: oral pharynx moist, no lesions Neck: supple no lymphadenopathy Cardiovascular: heart regular rate and rhythm, no murmur Lungs: clear to auscultation bilaterally Abdomen: soft, nontender, nondistended, no obvious ascites, no peritoneal signs, normal bowel sounds, no organomegaly Rectal: Deferred until colonoscopy Extremities: no clubbing, cyanosis, or lower extremity edema bilaterally Skin: no lesions on visible extremities Neuro: No focal deficits.  Cranial nerves intact  ASSESSMENT:  1.  Chronic constipation 2.  Rectal bleeding 3.  Change in stool caliber 4.  Dyspeptic complaints with bloating 5.  Gastric cancer in her father age 24 6.  History of kidney stones 7.  Menopause   PLAN:  1.  Schedule colonoscopy to evaluate rectal bleeding and change in  stool caliber.The nature of the procedure, as well as the risks, benefits, and alternatives were carefully and thoroughly reviewed with the patient. Ample time for discussion and questions allowed. The patient understood, was satisfied, and agreed to proceed. 2.  Recommended Citrucel 2 tablespoons daily constipation and thin stools 3.  Schedule upper endoscopy to evaluate dyspeptic complaints and screening for  gastric cancer given her family history.The nature of the procedure, as well as the risks, benefits, and alternatives were carefully and thoroughly reviewed with the patient. Ample time for discussion and questions allowed. The patient understood, was satisfied, and agreed to proceed. 4.  Further recommendations after the above

## 2023-08-09 ENCOUNTER — Encounter: Payer: Self-pay | Admitting: Internal Medicine

## 2023-08-23 ENCOUNTER — Telehealth: Payer: Self-pay | Admitting: Internal Medicine

## 2023-08-23 ENCOUNTER — Ambulatory Visit (AMBULATORY_SURGERY_CENTER): Payer: 59 | Admitting: Internal Medicine

## 2023-08-23 ENCOUNTER — Encounter: Payer: Self-pay | Admitting: Internal Medicine

## 2023-08-23 VITALS — BP 104/64 | HR 78 | Temp 97.5°F | Resp 7 | Ht 62.0 in | Wt 161.0 lb

## 2023-08-23 DIAGNOSIS — R1013 Epigastric pain: Secondary | ICD-10-CM | POA: Diagnosis not present

## 2023-08-23 DIAGNOSIS — Z800 Family history of malignant neoplasm of digestive organs: Secondary | ICD-10-CM | POA: Diagnosis not present

## 2023-08-23 DIAGNOSIS — K5901 Slow transit constipation: Secondary | ICD-10-CM | POA: Diagnosis not present

## 2023-08-23 DIAGNOSIS — K625 Hemorrhage of anus and rectum: Secondary | ICD-10-CM | POA: Diagnosis not present

## 2023-08-23 DIAGNOSIS — K319 Disease of stomach and duodenum, unspecified: Secondary | ICD-10-CM | POA: Diagnosis not present

## 2023-08-23 DIAGNOSIS — Z1211 Encounter for screening for malignant neoplasm of colon: Secondary | ICD-10-CM

## 2023-08-23 DIAGNOSIS — R194 Change in bowel habit: Secondary | ICD-10-CM

## 2023-08-23 DIAGNOSIS — Z120 Encounter for screening for malignant neoplasm of stomach: Secondary | ICD-10-CM | POA: Diagnosis present

## 2023-08-23 MED ORDER — SODIUM CHLORIDE 0.9 % IV SOLN: 500.0000 mL | Freq: Once | INTRAVENOUS | Status: DC

## 2023-08-23 NOTE — Progress Notes (Signed)
Pt's states no medical or surgical changes since previsit or office visit. 

## 2023-08-23 NOTE — Telephone Encounter (Signed)
Inbound call from patient's husband stating patient took her prep this morning for today's colonsocopy. Stated she was feeling nauseous and vomited a little bit. Stated patient did not vomit last night when taking prep. Requesting a call to discuss how to proceed. Please advise, thank you.

## 2023-08-23 NOTE — Op Note (Signed)
[facility] Patient Name: Mariah [name] Procedure Date: 08/23/2023 2:32 PM MRN: [id number] Endoscopist: Wilhemina Bonito. Marina Goodell , MD, [id number] Age: 47 Referring MD:  Date of Birth: [birth year] Gender: Female Account #: [id number] Procedure:                Colonoscopy Indications:              Colon cancer screening. Rectal bleeding, Change in                            bowel habits Medicines:                Monitored Anesthesia Care Procedure:                Pre-Anesthesia Assessment:                           - Prior to the procedure, a History and Physical                            was performed, and patient medications and                            allergies were reviewed. The patient's tolerance of                            previous anesthesia was also reviewed. The risks                            and benefits of the procedure and the sedation                            options and risks were discussed with the patient.                            All questions were answered, and informed consent                            was obtained. Prior Anticoagulants: The patient has                            taken no anticoagulant or antiplatelet agents. ASA                            Grade Assessment: II - A patient with mild systemic                            disease. After reviewing the risks and benefits,                            the patient was deemed in satisfactory condition to                            undergo the procedure.  After obtaining informed consent, the colonoscope                            was passed under direct vision. Throughout the                            procedure, the patient's blood pressure, pulse, and                            oxygen saturations were monitored continuously. The                            CF [id number] #[id number] was introduced through the anus                            and advanced to the the cecum, identified by                             appendiceal orifice and ileocecal valve. The                            ileocecal valve, appendiceal orifice, and rectum                            were photographed. The quality of the bowel                            preparation was excellent. The colonoscopy was                            performed without difficulty. The patient tolerated                            the procedure well. The bowel preparation used was                            SUPREP via split dose instruction. Scope In: 2:37:27 PM Scope Out: 2:48:03 PM Scope Withdrawal Time: 0 hours 8 minutes 2 seconds  Total Procedure Duration: 0 hours 10 minutes 36 seconds  Findings:                 The entire examined colon appeared normal on direct                            and retroflexion views. Small internal hemorrhoids                            noted. Complications:            No immediate complications. Estimated blood loss:                            None. Estimated Blood Loss:     Estimated blood loss: none. Impression:               - The entire  examined colon is normal on direct and                            retroflexion views.                           - Small internal hemorrhoids. Recommendation:           - Repeat colonoscopy in 10 years for screening                            purposes.                           - Patient has a contact number available for                            emergencies. The signs and symptoms of potential                            delayed complications were discussed with the                            patient. Return to normal activities tomorrow.                            Written discharge instructions were provided to the                            patient.                           - Resume previous diet.                           - Continue present medications. Wilhemina Bonito. Marina Goodell, MD 08/23/2023 2:53:12 PM This report has been signed electronically.

## 2023-08-23 NOTE — Op Note (Signed)
[name] Patient Name: Mariah [name] Procedure Date: 08/23/2023 2:59 PM MRN: [id number] Endoscopist: Wilhemina Bonito. Marina Goodell , MD, [id number] Age: 47 Referring MD:  Date of Birth: Nov 11, 1975 Gender: Female Account #: [id number] Procedure:                Upper GI endoscopy with biopsies Indications:              Dyspepsia, Family history of gastric cancer Medicines:                Monitored Anesthesia Care Procedure:                Pre-Anesthesia Assessment:                           - Prior to the procedure, a History and Physical                            was performed, and patient medications and                            allergies were reviewed. The patient's tolerance of                            previous anesthesia was also reviewed. The risks                            and benefits of the procedure and the sedation                            options and risks were discussed with the patient.                            All questions were answered, and informed consent                            was obtained. Prior Anticoagulants: The patient has                            taken no anticoagulant or antiplatelet agents. ASA                            Grade Assessment: II - A patient with mild systemic                            disease. After reviewing the risks and benefits,                            the patient was deemed in satisfactory condition to                            undergo the procedure.                           After obtaining informed consent, the endoscope was  passed under direct vision. Throughout the                            procedure, the patient's blood pressure, pulse, and                            oxygen saturations were monitored continuously. The                            Olympus Scope SN [id number] was introduced through the                            mouth, and advanced to the second part of duodenum.                             The upper GI endoscopy was accomplished without                            difficulty. The patient tolerated the procedure                            well. Scope In: Scope Out: Findings:                 The esophagus was normal.                           The stomach was normal. Biopsies were taken with a                            cold forceps for histology to rule out H. pylori.                           The examined duodenum was normal.                           The cardia and gastric fundus were normal on                            retroflexion. Complications:            No immediate complications. Estimated Blood Loss:     Estimated blood loss: none. Impression:               - Normal esophagus.                           - Normal stomach. Biopsied.                           - Normal examined duodenum. Recommendation:           - Patient has a contact number available for                            emergencies. The signs and symptoms of potential  delayed complications were discussed with the                            patient. Return to normal activities tomorrow.                            Written discharge instructions were provided to the                            patient.                           - Resume previous diet.                           - Continue present medications.                           - Await pathology results. Wilhemina Bonito. Marina Goodell, MD 08/23/2023 3:09:58 PM This report has been signed electronically.

## 2023-08-23 NOTE — Telephone Encounter (Signed)
Returned pts call.  She states that she had vomiting this morning once.  Her last BM was "cloudy yellow with some solid flakes". She does not feel that she can drink anymore liquids at this time.  Spoke with Dr. Marina Goodell.  Pt is ok to proceed with procedure.  Advised patient if she feels that she can drink any additional liquids before 12:00 to do so.  May give her an enema if needed when she arrives.

## 2023-08-23 NOTE — Patient Instructions (Addendum)
-   Resume previous diet. - Continue present medications. - Await pathology results.  YOU HAD AN ENDOSCOPIC PROCEDURE TODAY AT THE [facility]:   Refer to the procedure report that was given to you for any specific questions about what was found during the examination.  If the procedure report does not answer your questions, please call your gastroenterologist to clarify.  If you requested that your care partner not be given the details of your procedure findings, then the procedure report has been included in a sealed envelope for you to review at your convenience later.  YOU SHOULD EXPECT: Some feelings of bloating in the abdomen. Passage of more gas than usual.  Walking can help get rid of the air that was put into your GI tract during the procedure and reduce the bloating. If you had a lower endoscopy (such as a colonoscopy or flexible sigmoidoscopy) you may notice spotting of blood in your stool or on the toilet paper. If you underwent a bowel prep for your procedure, you may not have a normal bowel movement for a few days.  Please Note:  You might notice some irritation and congestion in your nose or some drainage.  This is from the oxygen used during your procedure.  There is no need for concern and it should clear up in a day or so.  SYMPTOMS TO REPORT IMMEDIATELY:  Following lower endoscopy (colonoscopy or flexible sigmoidoscopy):  Excessive amounts of blood in the stool  Significant tenderness or worsening of abdominal pains  Swelling of the abdomen that is new, acute  Fever of 100F or higher  Following upper endoscopy (EGD)  Vomiting of blood or coffee ground material  New chest pain or pain under the shoulder blades  Painful or persistently difficult swallowing  New shortness of breath  Fever of 100F or higher  Black, tarry-looking stools  For urgent or emergent issues, a gastroenterologist can be reached at any hour by calling [phone]. Do not use MyChart  messaging for urgent concerns.    DIET:  We do recommend a small meal at first, but then you may proceed to your regular diet.  Drink plenty of fluids but you should avoid alcoholic beverages for 24 hours.  ACTIVITY:  You should plan to take it easy for the rest of today and you should NOT DRIVE or use heavy machinery until tomorrow (because of the sedation medicines used during the test).    FOLLOW UP: Our staff will call the number listed on your records the next business day following your procedure.  We will call around 7:15- 8:00 am to check on you and address any questions or concerns that you may have regarding the information given to you following your procedure. If we do not reach you, we will leave a message.     If any biopsies were taken you will be contacted by phone or by letter within the next 1-3 weeks.  Please call us at [phone] if you have not heard about the biopsies in 3 weeks.    SIGNATURES/CONFIDENTIALITY: You and/or your care partner have signed paperwork which will be entered into your electronic medical record.  These signatures attest to the fact that that the information above on your After Visit Summary has been reviewed and is understood.  Full responsibility of the confidentiality of this discharge information lies with you and/or your care-partner.

## 2023-08-23 NOTE — Progress Notes (Signed)
Sedate, gd SR, tolerated procedure well, VSS, report to RN 

## 2023-08-23 NOTE — Progress Notes (Signed)
Expand All Collapse All HISTORY OF PRESENT ILLNESS:   Mariah [name] is a 47 y.o. 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 Arelia Sneddon regarding change in bowel habits, rectal bleeding, and family history of gastrointestinal cancers.   First, the patient tells me that she has noted change in her stool caliber.  More thin.  She has had longstanding issues with constipation.  She does not take regular agents.  Typically has a daily bowel movement.  She has been increasing her water consumption and exercise.  This helps some.  She is also noticed some significant abdominal bloating.  As well, rectal bleeding with blood in the toilet bowl infrequently.  Had clots on 1 occasion.  She does not think this is hemorrhoids.  Previous colonoscopy September 2016 was normal including intubation of the terminal ileum.   Next, she reports a family history of gastric cancer in her father, who succumbed to the disease at age 71.  She is concerned.  She is interested in screening and wants to be sure that her abdominal complaints are unrelated.   CT of the abdomen January 2023 revealed right-sided kidney stone with obstruction     REVIEW OF SYSTEMS:   All non-GI ROS negative unless otherwise stated in the HPI except for sinus and allergy trouble, anxiety, back pain, breast changes, visual changes, headaches, heart murmur, night sweats, nosebleeds, shortness of breath, sleeping problems       Past Medical History:  Diagnosis Date   Allergy     Anemia     Asthma     Blood clot in vein      right arm - from IV   Colitis     Hemorrhoid     Neuromuscular disorder (HCC)      neuropathy right arm               Past Surgical History:  Procedure Laterality Date   APPENDECTOMY       KNEE SURGERY Right      tumor removed   WISDOM TOOTH EXTRACTION              Social History [name]  reports that she has never smoked. She has never used  smokeless tobacco. She reports current alcohol use. She reports that she does not use drugs.   family history includes Cancer in her paternal grandmother; Colon cancer in her paternal grandmother; Gallstones in her mother; Nephrolithiasis in her sister; Pancreatic cancer in her father; Prostate cancer in her maternal grandfather.   Allergies      Allergies  Allergen Reactions   Molds & Smuts Cough   Other Swelling   Prunus Persica Anaphylaxis   Robitussin (Alcohol Free) [Guaifenesin] Swelling            PHYSICAL EXAMINATION: Vital signs: BP 104/70   Pulse 73   Ht 5\' 2"  (1.575 m)   Wt 161 lb (73 kg)   LMP 06/15/2023   BMI 29.45 kg/m   Constitutional: generally well-appearing, no acute distress Psychiatric: alert and oriented x3, cooperative Eyes: extraocular movements intact, anicteric, conjunctiva pink Mouth: oral pharynx moist, no lesions Neck: supple no lymphadenopathy Cardiovascular: heart regular rate and rhythm, no murmur Lungs: clear to auscultation bilaterally Abdomen: soft, nontender, nondistended, no obvious ascites, no peritoneal signs, normal bowel sounds, no organomegaly Rectal: Deferred until colonoscopy Extremities: no clubbing, cyanosis, or lower extremity edema bilaterally Skin: no lesions on visible extremities Neuro: No focal  deficits.  Cranial nerves intact   ASSESSMENT:   1.  Chronic constipation 2.  Rectal bleeding 3.  Change in stool caliber 4.  Dyspeptic complaints with bloating 5.  Gastric cancer in her father age 50 6.  History of kidney stones 7.  Menopause     PLAN:   1.  Schedule colonoscopy to evaluate rectal bleeding and change in stool caliber.The nature of the procedure, as well as the risks, benefits, and alternatives were carefully and thoroughly reviewed with the patient. Ample time for discussion and questions allowed. The patient understood, was satisfied, and agreed to proceed. 2.  Recommended Citrucel 2 tablespoons daily  constipation and thin stools 3.  Schedule upper endoscopy to evaluate dyspeptic complaints and screening for gastric cancer given her family history.The nature of the procedure, as well as the risks, benefits, and alternatives were carefully and thoroughly reviewed with the patient. Ample time for discussion and questions allowed. The patient understood, was satisfied, and agreed to proceed. 4.  Further recommendations after the above

## 2023-08-26 ENCOUNTER — Other Ambulatory Visit: Payer: 59

## 2023-08-26 ENCOUNTER — Telehealth: Payer: Self-pay

## 2023-08-26 NOTE — Telephone Encounter (Signed)
  Follow up Call-     08/23/2023    2:03 PM  Call back number  Post procedure Call Back phone  # [phone]  Permission to leave phone message Yes     Patient questions:  Do you have a fever, pain , or abdominal swelling? No. Pain Score  0 *  Have you tolerated food without any problems? Yes.    Have you been able to return to your normal activities? Yes.    Do you have any questions about your discharge instructions: Diet   No. Medications  No. Follow up visit  No.  Do you have questions or concerns about your Care? No.  Actions: * If pain score is 4 or above: No action needed, pain <4.

## 2023-08-27 ENCOUNTER — Other Ambulatory Visit (INDEPENDENT_AMBULATORY_CARE_PROVIDER_SITE_OTHER): Payer: 59

## 2023-08-27 DIAGNOSIS — R7303 Prediabetes: Secondary | ICD-10-CM | POA: Diagnosis not present

## 2023-08-27 DIAGNOSIS — E785 Hyperlipidemia, unspecified: Secondary | ICD-10-CM | POA: Diagnosis not present

## 2023-08-27 LAB — HEMOGLOBIN A1C: Hgb A1c MFr Bld: 5.9 % (ref 4.6–6.5)

## 2023-08-27 LAB — LIPID PANEL
Cholesterol: 200 mg/dL (ref 0–200)
HDL: 51.2 mg/dL (ref >39.00)
LDL Cholesterol: 133 mg/dL — ABNORMAL HIGH (ref 0–99)
NonHDL: 148.51
Total CHOL/HDL Ratio: 4
Triglycerides: 80 mg/dL (ref 0.0–149.0)
VLDL: 16 mg/dL (ref 0.0–40.0)

## 2023-08-28 ENCOUNTER — Encounter: Payer: Self-pay | Admitting: Internal Medicine

## 2023-08-28 LAB — SURGICAL PATHOLOGY

## 2023-11-21 IMAGING — CT CT ABD-PELV W/ CM
2 of 5 series · 16 of 46 positions shown, 18 images · IV contrast (Omnipaque)
Comparison: CT abdomen and pelvis dated May 19, 2018

CLINICAL DATA: Abdominal pain

EXAM:
CT ABDOMEN AND PELVIS WITH CONTRAST
TECHNIQUE: Multidetector CT imaging of the abdomen and pelvis was performed
using the standard protocol following bolus administration of
intravenous contrast.

[Series 2: axial st · axial · 0.94mm/px · z∈[-416,-26]mm · 13 of 88 slices shown, 15 images]
[im 5/88  soft-tissue]
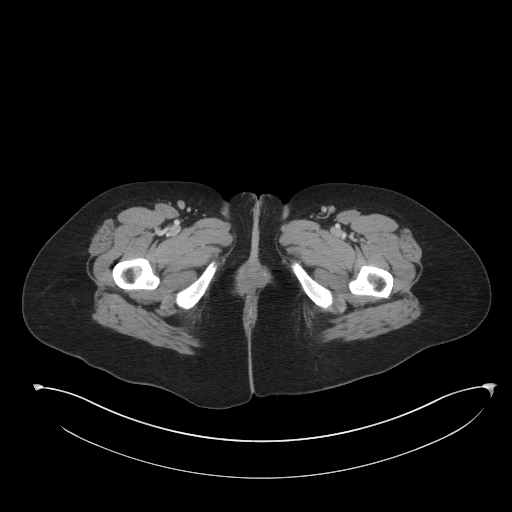
[im 5/88  bone]
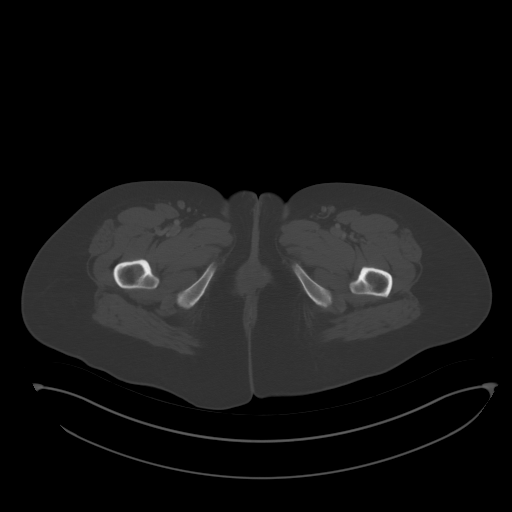
[im 14/88  soft-tissue]
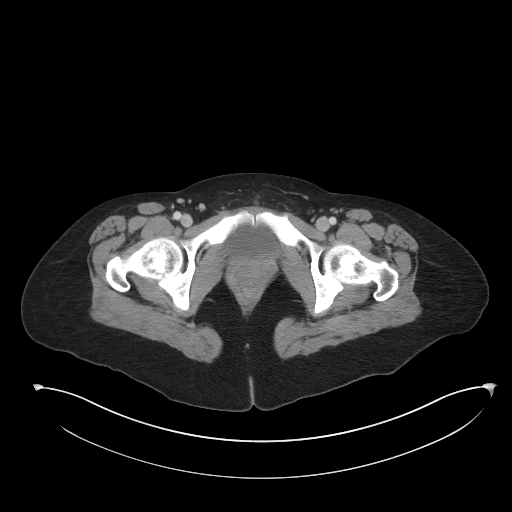
[im 19/88  soft-tissue]
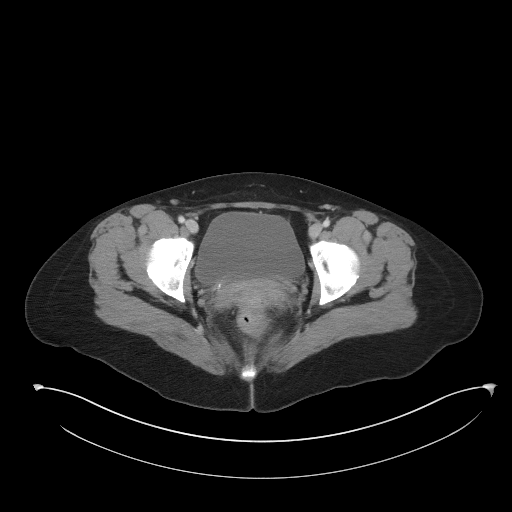
[im 23/88  soft-tissue]
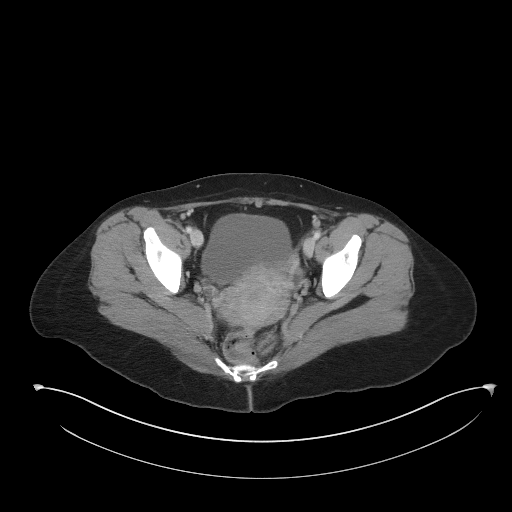
[im 33/88  soft-tissue]
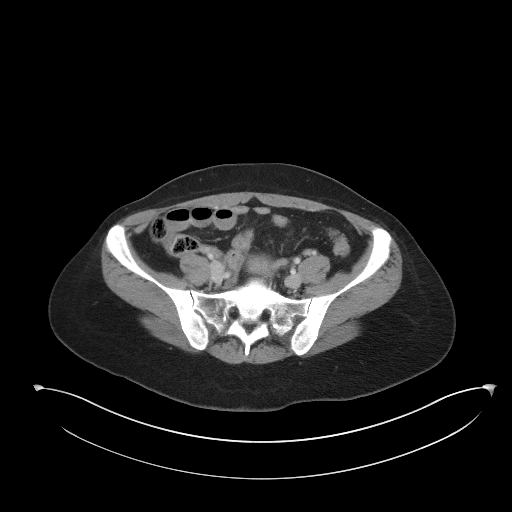
[im 37/88  soft-tissue]
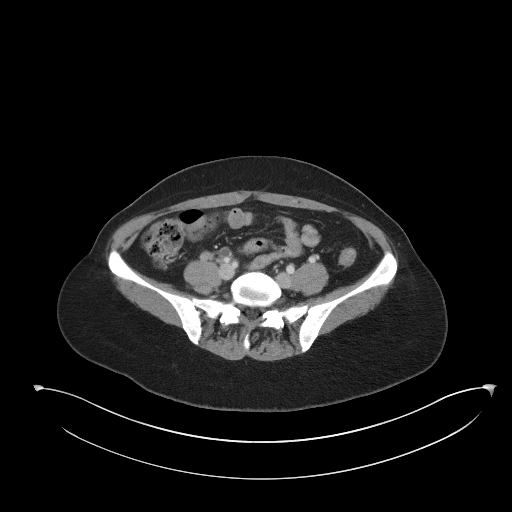
[im 46/88  soft-tissue]
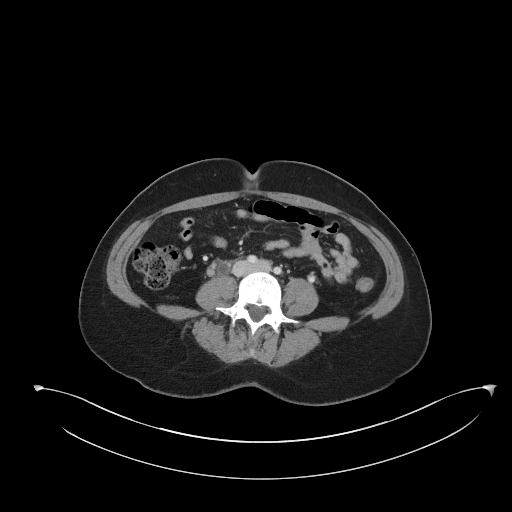
[im 51/88  soft-tissue]
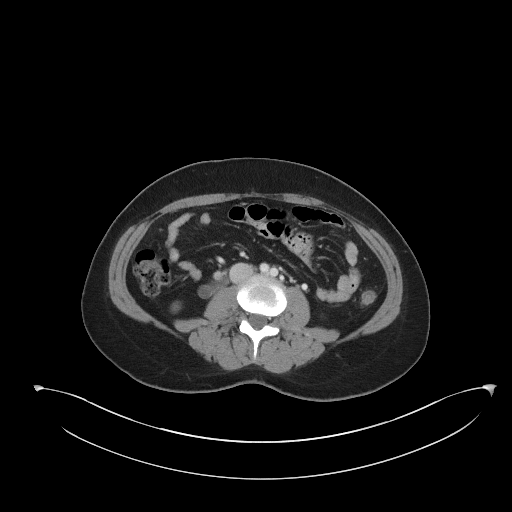
[im 55/88  soft-tissue]
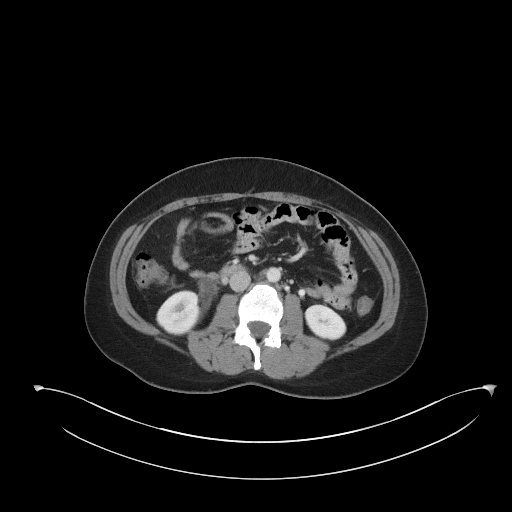
[im 55/88  bone]
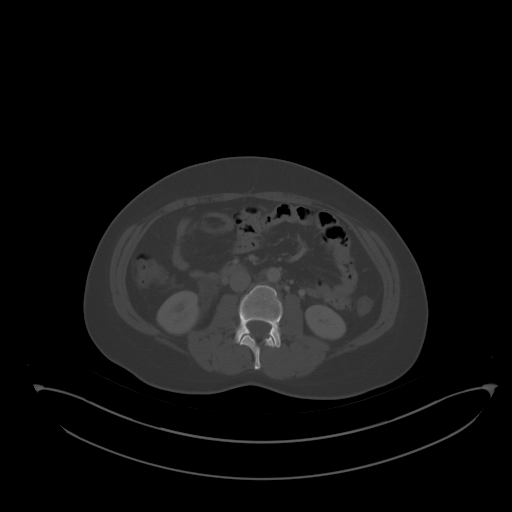
[im 65/88  soft-tissue]
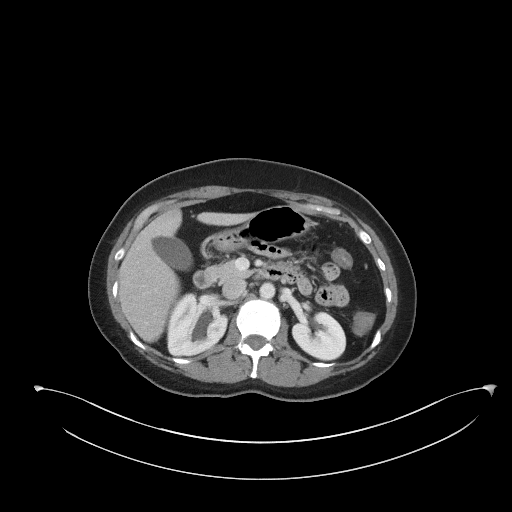
[im 69/88  soft-tissue]
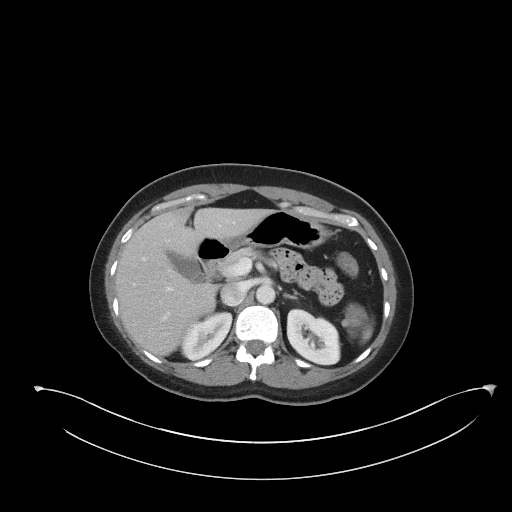
[im 74/88  soft-tissue]
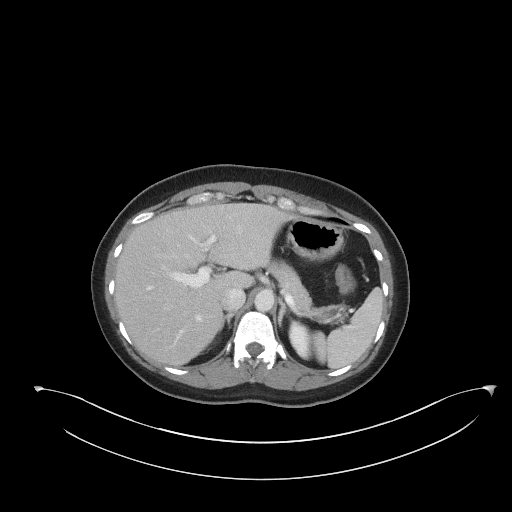
[im 83/88  soft-tissue]
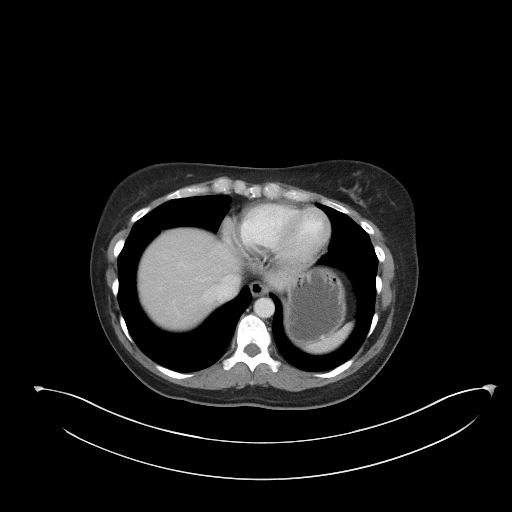

[Series 5: coronal st · coronal · 0.81mm/px · 3 of 80 slices shown]
[im 27/80  soft-tissue]
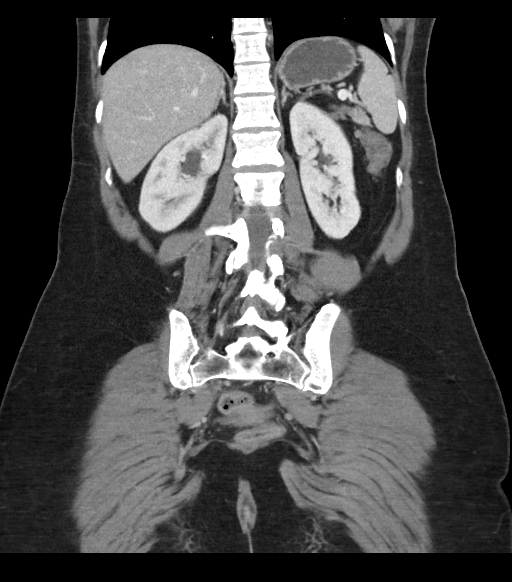
[im 36/80  soft-tissue]
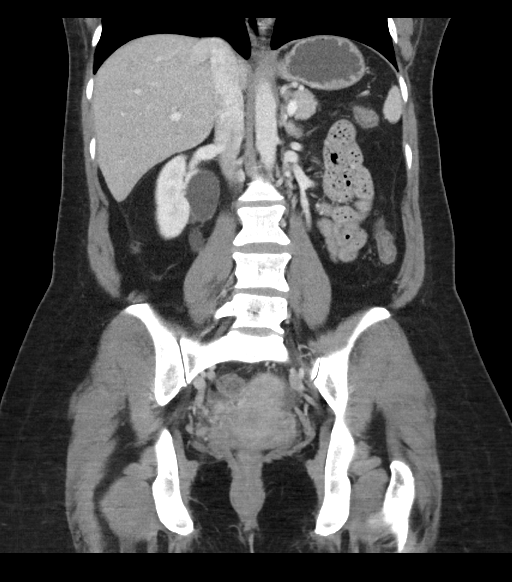
[im 44/80  soft-tissue]
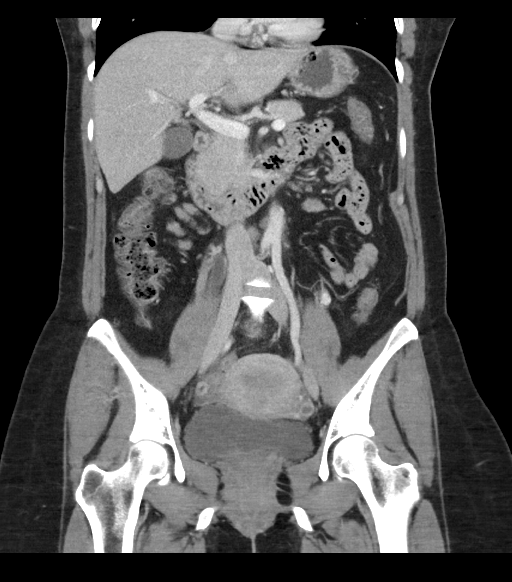

[16 of 46 positions shown; findings below may reference images not displayed]

RADIATION DOSE REDUCTION: This exam was performed according to the
departmental dose-optimization program which includes automated
exposure control, adjustment of the mA and/or kV according to
patient size and/or use of iterative reconstruction technique.

CONTRAST:  100mL OMNIPAQUE IOHEXOL 300 MG/ML  SOLN
FINDINGS: Lower chest: No acute abnormality.

Hepatobiliary: No focal liver abnormality is seen. No gallstones,
gallbladder wall thickening, or biliary dilatation.

Pancreas: Unremarkable. No pancreatic ductal dilatation or
surrounding inflammatory changes.

Spleen: Normal in size without focal abnormality.

Adrenals/Urinary Tract: Bilateral adrenal glands are unremarkable.
Severe right hydronephrosis and hydroureter with periureter
inflammation. A 4 mm stone seen at the right ureter vesicular
junction. Left kidney is unremarkable. Bladder is unremarkable.

Stomach/Bowel: Stomach is within normal limits. Appendix is not
visualized, although there are no secondary findings of acute
appendicitis. No evidence of bowel wall thickening, distention, or
inflammatory changes.

Vascular/Lymphatic: No significant vascular findings are present. No
enlarged abdominal or pelvic lymph nodes.

Reproductive: Uterus and bilateral adnexa are unremarkable.

Other: Trace free fluid in the pelvis which is likely physiologic.

Musculoskeletal: No acute or significant osseous findings.
IMPRESSION: Severe right hydronephrosis and hydroureter with a obstructing 4 mm
stone seen at the right ureter vesicular junction.

## 2024-06-26 ENCOUNTER — Ambulatory Visit (INDEPENDENT_AMBULATORY_CARE_PROVIDER_SITE_OTHER): Payer: 59 | Admitting: Family Medicine

## 2024-06-26 ENCOUNTER — Ambulatory Visit: Payer: Self-pay | Admitting: Family Medicine

## 2024-06-26 VITALS — BP 110/70 | HR 70 | Temp 97.8°F | Resp 12 | Ht 62.0 in | Wt 148.0 lb

## 2024-06-26 DIAGNOSIS — E785 Hyperlipidemia, unspecified: Secondary | ICD-10-CM | POA: Diagnosis not present

## 2024-06-26 DIAGNOSIS — Z13228 Encounter for screening for other metabolic disorders: Secondary | ICD-10-CM | POA: Diagnosis not present

## 2024-06-26 DIAGNOSIS — Z130 Encounter for screening for diseases of the blood and blood-forming organs and certain disorders involving the immune mechanism: Secondary | ICD-10-CM | POA: Diagnosis not present

## 2024-06-26 DIAGNOSIS — Z1329 Encounter for screening for other suspected endocrine disorder: Secondary | ICD-10-CM | POA: Diagnosis not present

## 2024-06-26 DIAGNOSIS — Z1159 Encounter for screening for other viral diseases: Secondary | ICD-10-CM

## 2024-06-26 DIAGNOSIS — Z0000 Encounter for general adult medical examination without abnormal findings: Secondary | ICD-10-CM | POA: Diagnosis not present

## 2024-06-26 LAB — LIPID PANEL
Cholesterol: 234 mg/dL — ABNORMAL HIGH (ref 0–200)
HDL: 74.3 mg/dL (ref >39.00)
LDL Cholesterol: 145 mg/dL — ABNORMAL HIGH (ref 0–99)
NonHDL: 159.79
Total CHOL/HDL Ratio: 3
Triglycerides: 75 mg/dL (ref 0.0–149.0)
VLDL: 15 mg/dL (ref 0.0–40.0)

## 2024-06-26 LAB — BASIC METABOLIC PANEL WITH GFR
BUN: 12 mg/dL (ref 6–23)
CO2: 31 meq/L (ref 19–32)
Calcium: 9.5 mg/dL (ref 8.4–10.5)
Chloride: 102 meq/L (ref 96–112)
Creatinine, Ser: 0.69 mg/dL (ref 0.40–1.20)
GFR: 103.68 mL/min (ref >60.00)
Glucose, Bld: 102 mg/dL — ABNORMAL HIGH (ref 70–99)
Potassium: 4.4 meq/L (ref 3.5–5.1)
Sodium: 140 meq/L (ref 135–145)

## 2024-06-26 LAB — HEMOGLOBIN A1C: Hgb A1c MFr Bld: 6.4 % (ref 4.6–6.5)

## 2024-06-26 NOTE — Assessment & Plan Note (Signed)
 We discussed the importance of regular physical activity and healthy diet for prevention of chronic illness and/or complications. Preventive guidelines reviewed. Vaccination up today.  She prefers to hold on flu vaccine and she consulted with immunologist, currently on immunotherapy 2 times per week. Continue her female preventive care with gynecologist. Next CPE in a year.

## 2024-06-26 NOTE — Assessment & Plan Note (Signed)
Continue non pharmacologic treatment. °Further recommendations according to FLP result. °

## 2024-06-26 NOTE — Progress Notes (Signed)
 Chief Complaint  Patient presents with   Annual Exam   Discussed the use of AI scribe software for clinical note transcription with the patient, who gave verbal consent to proceed.  History of Present Illness A 48 year old female PMHx significant for mild MR, HLD, and back pain who presents for an annual physical exam. No new problems since her last visit, 06/25/23. Since her last visit she has followed with immunologist, on immunotherapy for allergic rhinitis.  She engages in regular physical activity, attending the gym three times a week and walking for two hours two to three times a week. She has lost 15 pounds since her last visit, previously weighing 161 pounds. She attributes her weight loss to consistent exercise and maintains a healthy diet, cooking at home and consuming vegetables daily.  She sleeps six to seven hours per night, aiming for seven hours as ideal.  She consumes alcohol socially, limiting herself to no more than two beers or two glasses of wine in one sitting, and has not consumed alcohol since April or May.  She has never smoked, although her mother was a smoker.  She has been seeing a gynecologist regularly. She was on estradiol and progesterone for a year but continued to have vaginal bleeding, so discontinued.She underwent pelvic/transvaginal ultrasound and endometrial biopsy, showing no polyps or other serious issues. She has resumed hormonal therapy.  Immunization History  Administered Date(s) Administered   PFIZER Comirnaty Alejos Top)Covid-19 Tri-Sucrose Vaccine 12/09/2020   PFIZER(Purple Top)SARS-COV-2 Vaccination 12/14/2020   Tdap 06/25/2023   Unspecified SARS-COV-2 Vaccination 01/17/2020, 02/07/2020, 12/09/2020   Health Maintenance  Topic Date Due   Hepatitis C Screening  Never done   COVID-19 Vaccine (6 - 2025-26 season) 07/12/2024 (Originally 06/22/2024)   Influenza Vaccine  01/19/2025 (Originally 05/22/2024)   Pneumococcal Vaccine (1 of 2 - PCV)  06/26/2025 (Originally 07/10/1996)   Hepatitis B Vaccines 19-59 Average Risk (1 of 3 - 19+ 3-dose series) 06/26/2025 (Originally 07/10/1996)   Cervical Cancer Screening (HPV/Pap Cotest)  02/27/2026   DTaP/Tdap/Td (2 - Td or Tdap) 06/24/2033   Colonoscopy  08/22/2033   HIV Screening  Completed   HPV VACCINES  Aged Out   Meningococcal B Vaccine  Aged Out   Hyperlipidemia: Currently on nonpharmacologic treatment. Lab Results  Component Value Date   CHOL 200 08/27/2023   HDL 51.20 08/27/2023   LDLCALC 133 (H) 08/27/2023   TRIG 80.0 08/27/2023   CHOLHDL 4 08/27/2023   Lab Results  Component Value Date   HGBA1C 5.9 08/27/2023   She has no concerns today.  Review of Systems  Constitutional:  Negative for activity change, appetite change and fever.  HENT:  Negative for mouth sores, sore throat and trouble swallowing.   Eyes:  Negative for redness and visual disturbance.  Respiratory:  Negative for cough, shortness of breath and wheezing.   Cardiovascular:  Negative for chest pain and leg swelling.  Gastrointestinal:  Negative for abdominal pain, nausea and vomiting.  Endocrine: Negative for cold intolerance, heat intolerance, polydipsia, polyphagia and polyuria.  Genitourinary:  Negative for decreased urine volume, dysuria and hematuria.  Musculoskeletal:  Negative for gait problem and myalgias.  Skin:  Negative for color change and rash.  Allergic/Immunologic: Positive for environmental allergies.  Neurological:  Negative for syncope, weakness and headaches.  Hematological:  Negative for adenopathy. Does not bruise/bleed easily.  Psychiatric/Behavioral:  Negative for confusion. The patient is not nervous/anxious.   All other systems reviewed and are negative.  Current Outpatient Medications on  File Prior to Visit  Medication Sig Dispense Refill   albuterol (VENTOLIN HFA) 108 (90 Base) MCG/ACT inhaler      EPINEPHrine 0.3 mg/0.3 mL IJ SOAJ injection      estradiol (VIVELLE-DOT)  0.05 MG/24HR patch Apply 1 patch twice a week by transdermal route for 30 days.     fluticasone (FLONASE) 50 MCG/ACT nasal spray      montelukast (SINGULAIR) 10 MG tablet TAKE 1 TABLET BY MOUTH EVERY EVENING     OVER THE COUNTER MEDICATION Pt taking allertec     progesterone (PROMETRIUM) 100 MG capsule Take 1 capsule every day by oral route at bedtime for 30 days.     No current facility-administered medications on file prior to visit.   Past Medical History:  Diagnosis Date   Allergy    Anemia    Asthma    Blood clot in vein    right arm - from IV   Colitis    Heart murmur 2024   Hemorrhoid    Neuromuscular disorder (HCC)    neuropathy right arm   Past Surgical History:  Procedure Laterality Date   APPENDECTOMY     KNEE SURGERY Right    tumor removed   WISDOM TOOTH EXTRACTION     Allergies  Allergen Reactions   Molds & Smuts Cough   Other Swelling   Prunus Persica Anaphylaxis   Robitussin (Alcohol Free) [Guaifenesin] Swelling    Family History  Problem Relation Age of Onset   Gallstones Mother    Miscarriages / [place] Mother    Pancreatic cancer Father    Cancer Father    Nephrolithiasis Sister    Cancer Paternal Grandmother        type unknown   Colon cancer Paternal Grandmother    Prostate cancer Maternal Grandfather    Cancer Maternal Grandfather    Esophageal cancer Neg Hx    Rectal cancer Neg Hx    Stomach cancer Neg Hx     Social History   Socioeconomic History   Marital status: Married    Spouse name: Not on file   Number of children: 2   Years of education: Not on file   Highest education level: Some college, no degree  Occupational History   Occupation: stay at home mom   Occupation: notary  Tobacco Use   Smoking status: Never   Smokeless tobacco: Never  Vaping Use   Vaping status: Never Used  Substance and Sexual Activity   Alcohol use: Yes    Comment: social   Drug use: No   Sexual activity: Yes    Birth control/protection:  None  Other Topics Concern   Not on file  Social History Narrative   Not on file   Social Drivers of Health   Financial Resource Strain: Low Risk  (06/26/2024)   Overall Financial Resource Strain (CARDIA)    Difficulty of Paying Living Expenses: Not hard at all  Food Insecurity: No Food Insecurity (06/26/2024)   Hunger Vital Sign    Worried About Running Out of Food in the Last Year: Never true    Ran Out of Food in the Last Year: Never true  Transportation Needs: No Transportation Needs (06/26/2024)   PRAPARE - Administrator, Civil Service (Medical): No    Lack of Transportation (Non-Medical): No  Physical Activity: Sufficiently Active (06/26/2024)   Exercise Vital Sign    Days of Exercise per Week: 3 days    Minutes of Exercise per Session:  120 min  Stress: No Stress Concern Present (06/26/2024)   [employer] of Occupational Health - Occupational Stress Questionnaire    Feeling of Stress: Not at all  Social Connections: Socially Isolated (06/26/2024)   Social Connection and Isolation Panel    Frequency of Communication with Friends and Family: Once a week    Frequency of Social Gatherings with Friends and Family: Once a week    Attends Religious Services: Never    Database administrator or Organizations: No    Attends Engineer, structural: Not on file    Marital Status: Married   Vitals:   06/26/24 0859  BP: 110/70  Pulse: 70  Resp: 12  Temp: 97.8 F (36.6 C)  SpO2: 97%   Body mass index is 27.07 kg/m.  Wt Readings from Last 3 Encounters:  06/26/24 148 lb (67.1 kg)  08/23/23 161 lb (73 kg)  07/31/23 161 lb (73 kg)   Physical Exam Vitals and nursing note reviewed.  Constitutional:      General: She is not in acute distress.    Appearance: She is well-developed.  HENT:     Head: Normocephalic and atraumatic.     Right Ear: Tympanic membrane, ear canal and external ear normal.     Left Ear: Tympanic membrane, ear canal and external ear normal.      Mouth/Throat:     Mouth: Mucous membranes are moist.     Pharynx: Oropharynx is clear. Uvula midline.  Eyes:     Extraocular Movements: Extraocular movements intact.     Conjunctiva/sclera: Conjunctivae normal.     Pupils: Pupils are equal, round, and reactive to light.  Neck:     Thyroid: No thyroid mass or thyromegaly.  Cardiovascular:     Rate and Rhythm: Normal rate and regular rhythm.     Pulses:          Dorsalis pedis pulses are 2+ on the right side and 2+ on the left side.     Heart sounds: No murmur (none appreciated today.) heard. Pulmonary:     Effort: Pulmonary effort is normal. No respiratory distress.     Breath sounds: Normal breath sounds.  Abdominal:     Palpations: Abdomen is soft. There is no hepatomegaly or mass.     Tenderness: There is no abdominal tenderness.  Genitourinary:    Comments: Deferred to gyn. Musculoskeletal:     Right lower leg: No edema.     Left lower leg: No edema.     Comments: No major deformity or signs of synovitis appreciated.  Lymphadenopathy:     Cervical: No cervical adenopathy.     Upper Body:     Right upper body: No supraclavicular adenopathy.     Left upper body: No supraclavicular adenopathy.  Skin:    General: Skin is warm.     Findings: No erythema or rash.  Neurological:     General: No focal deficit present.     Mental Status: She is alert and oriented to person, place, and time.     Cranial Nerves: No cranial nerve deficit.     Coordination: Coordination normal.     Gait: Gait normal.     Deep Tendon Reflexes:     Reflex Scores:      Bicep reflexes are 2+ on the right side and 2+ on the left side.      Patellar reflexes are 2+ on the right side and 2+ on the left side. Psychiatric:  Mood and Affect: Mood and affect normal.   ASSESSMENT AND PLAN: Ms. Lona Six was here today annual physical examination.  Orders Placed This Encounter  Procedures   Basic metabolic panel with GFR   Hepatitis C  antibody   Lipid panel   Hemoglobin A1c   Lab Results  Component Value Date   HGBA1C 6.4 06/26/2024   Lab Results  Component Value Date   CHOL 234 (H) 06/26/2024   HDL 74.30 06/26/2024   LDLCALC 145 (H) 06/26/2024   TRIG 75.0 06/26/2024   CHOLHDL 3 06/26/2024   Lab Results  Component Value Date   NA 140 06/26/2024   CL 102 06/26/2024   K 4.4 06/26/2024   CO2 31 06/26/2024   BUN 12 06/26/2024   CREATININE 0.69 06/26/2024   GFR 103.68 06/26/2024   CALCIUM 9.5 06/26/2024   ALBUMIN 4.1 11/17/2021   GLUCOSE 102 (H) 06/26/2024   The 10-year ASCVD risk score (Arnett DK, et al., 2019) is: 0.5%   Values used to calculate the score:     Age: 53 years     Clincally relevant sex: Female     Is Non-Hispanic African American: No     Diabetic: No     Tobacco smoker: No     Systolic Blood Pressure: 110 mmHg     Is BP treated: No     HDL Cholesterol: 74.3 mg/dL     Total Cholesterol: 234 mg/dL  Routine general medical examination at a health care facility Assessment & Plan: We discussed the importance of regular physical activity and healthy diet for prevention of chronic illness and/or complications. Preventive guidelines reviewed. Vaccination up today.  She prefers to hold on flu vaccine and she consulted with immunologist, currently on immunotherapy 2 times per week. Continue her female preventive care with gynecologist. Next CPE in a year.   Hyperlipidemia, unspecified hyperlipidemia type Assessment & Plan: Continue non pharmacologic treatment. Further recommendations according to FLP result.  Orders: -     Basic metabolic panel with GFR; Future -     Lipid panel; Future  Encounter for HCV screening test for low risk patient -     Hepatitis C antibody; Future  Screening for endocrine, metabolic and immunity disorder -     Basic metabolic panel with GFR; Future -     Hemoglobin A1c; Future  Return in 1 year (on 06/26/2025) for CPE.  Marcel Sorter G. [place], MD  [facility]. [place] office.

## 2024-06-26 NOTE — Patient Instructions (Addendum)
 A few things to remember from today's visit:  Hyperlipidemia, unspecified hyperlipidemia type - Plan: Basic metabolic panel with GFR, Lipid panel  Encounter for HCV screening test for low risk patient - Plan: Hepatitis C antibody  Routine general medical examination at a health care facility  Screening for endocrine, metabolic and immunity disorder - Plan: Basic metabolic panel with GFR, Hemoglobin A1c  Do not use My Chart to request refills or for acute issues that need immediate attention. If you send a my chart message, it may take a few days to be addressed, specially if I am not in the office.  Please be sure medication list is accurate. If a new problem present, please set up appointment sooner than planned today.  Mantenimiento de Radiographer, therapeutic en las mujeres Health Maintenance, Female Adoptar un estilo de vida saludable y recibir atencin preventiva son importantes para promover la salud y Counsellor. Consulte al mdico sobre: El esquema adecuado para hacerse pruebas y exmenes peridicos. Cosas que puede hacer por su cuenta para prevenir enfermedades y [place] sano. Qu debo saber sobre la dieta, el peso y el ejercicio? Consuma una dieta saludable  Consuma una dieta que incluya muchas verduras, frutas, productos lcteos con bajo contenido de grasa y protenas magras. No consuma muchos alimentos ricos en grasas slidas, azcares agregados o sodio. Mantenga un peso saludable El ndice de masa muscular [facility]) se cocos (keeling) islands para identificar problemas de [place]. Proporciona una estimacin de la grasa corporal basndose en el peso y la altura. Su mdico puede ayudarle a determinar su IMC y a Personnel officer o Pharmacologist un peso saludable. Haga ejercicio con regularidad Haga ejercicio con regularidad. Esta es una de las prcticas ms importantes que puede hacer por su salud. La [employer] de los adultos deben seguir estas pautas: Education officer, environmental, al menos, 150 minutos de actividad fsica por semana. El  ejercicio debe aumentar la frecuencia cardaca y Media planner transpirar (ejercicio de intensidad moderada). Hacer ejercicios de fortalecimiento por lo [facility] por semana. Agregue esto a su plan de ejercicio de intensidad moderada. Pase menos tiempo sentada. Incluso la actividad fsica ligera puede ser beneficiosa. Controle sus niveles de colesterol y lpidos en la sangre Comience a realizarse anlisis de lpidos y Oncologist en la sangre a los 20 aos y luego reptalos cada 5 aos. Hgase controlar los niveles de colesterol con mayor frecuencia si: Sus niveles de lpidos y colesterol son altos. Es mayor de 40 aos. Presenta un alto riesgo de padecer enfermedades cardacas. Qu debo saber sobre las pruebas de deteccin del cncer? Segn su historia clnica y sus antecedentes familiares, es posible que deba realizarse pruebas de deteccin del cncer en diferentes edades. Esto puede incluir pruebas de deteccin de lo siguiente: Cncer de mama. Cncer de cuello uterino. Cncer colorrectal. Cncer de piel. Cncer de pulmn. Qu debo saber sobre la enfermedad cardaca, la diabetes y la hipertensin arterial? Presin arterial y enfermedad cardaca La hipertensin arterial causa enfermedades cardacas y lesotho el riesgo de accidente cerebrovascular. Es ms probable que esto se manifieste en las personas que tienen lecturas de presin arterial alta o tienen sobrepeso. Hgase controlar la presin arterial: Cada 3 a 5 aos si tiene entre 18 y 66 aos. Todos los aos si es mayor de 40 aos. Diabetes Realcese exmenes de deteccin de la diabetes con regularidad. Este anlisis revisa el nivel de azcar en la sangre en [place]. Hgase las pruebas de deteccin: Cada tres aos despus de los 40 aos de edad si tiene un peso normal  y un bajo riesgo de padecer diabetes. Con ms frecuencia y a partir de [place] edad inferior si tiene sobrepeso o un alto riesgo de padecer diabetes. Qu debo saber sobre la  prevencin de infecciones? Hepatitis B Si tiene un riesgo ms alto de contraer hepatitis B, debe someterse a un examen de deteccin de este virus. Hable con el mdico para averiguar si tiene riesgo de contraer la infeccin por hepatitis B. Hepatitis C Se recomienda el anlisis a: [employer] 1945 y 1965. Todas las personas que tengan un riesgo de haber contrado hepatitis C. Enfermedades de transmisin sexual (ETS) Hgase las pruebas de Airline pilot de ITS, incluidas la gonorrea y la clamidia, si: Es sexualmente activa y es menor de [address]. Es mayor de [address], y Public affairs consultant informa que corre riesgo de tener este tipo de infecciones. La actividad sexual ha cambiado desde que le hicieron la ltima prueba de deteccin y tiene un riesgo mayor de tener clamidia o Copy. Pregntele al mdico si usted tiene riesgo. Pregntele al mdico si usted tiene un alto riesgo de Primary school teacher VIH. El mdico tambin puede recomendarle un medicamento recetado para ayudar a evitar la infeccin por el VIH. Si elige tomar medicamentos para prevenir el VIH, primero debe [employer] de deteccin del VIH. Luego debe hacerse anlisis cada 3 meses mientras est tomando los medicamentos. Embarazo Si est por dejar de menstruar (fase premenopusica) y usted puede quedar embarazada, busque asesoramiento antes de quedar embarazada. Tome de 400 a 800 microgramos (mcg) de cido flico todos los das si queda embarazada. Pida mtodos de control de la natalidad (anticonceptivos) si desea evitar un embarazo no deseado. Osteoporosis y rwanda La osteoporosis es una enfermedad en la que los huesos pierden los minerales y la fuerza por el avance de la edad. El resultado pueden ser fracturas en los [place]. Si tiene 65 aos o ms, o si est en riesgo de sufrir osteoporosis y fracturas, pregunte a su mdico si debe: Hacerse pruebas de deteccin de prdida sea. Tomar un suplemento de calcio o de vitamina D para  reducir el riesgo de fracturas. Recibir terapia de reemplazo hormonal (TRH) para tratar los sntomas de la menopausia. Siga estas indicaciones en su casa: Consumo de alcohol No beba alcohol si: Su mdico le indica no hacerlo. Est embarazada, puede estar embarazada o est tratando de quedar embarazada. Si bebe alcohol: Limite la cantidad que bebe a lo siguiente: De 0 a 1 bebida por da. Sepa cunta cantidad de alcohol hay en las bebidas que toma. En los [address], una medida equivale a una botella de cerveza de 12 oz (355 ml), un vaso de vino de 5 oz (148 ml) o un vaso de una bebida alcohlica de alta graduacin de 1 oz (44 ml). Estilo de vida No consuma ningn producto que contenga nicotina o tabaco. Estos productos incluyen cigarrillos, tabaco para Theatre manager y aparatos de vapeo, como los cigarrillos electrnicos. Si necesita ayuda para dejar de consumir estos productos, consulte al mdico. No consuma drogas. No comparta agujas. Solicite ayuda a su mdico si necesita apoyo o informacin para abandonar las drogas. Indicaciones generales Realcese los estudios de rutina de [address], dentales y de Wellsite geologist. Mantngase al da con las vacunas. Infrmele a su mdico si: Se siente deprimida con frecuencia. Alguna vez ha sido vctima de maltrato o no se siente seguro en su casa. Resumen Adoptar un estilo de vida saludable y recibir atencin preventiva son importantes para promover la  salud y Counsellor. Siga las instrucciones del mdico acerca de una dieta saludable, el ejercicio y la realizacin de pruebas o exmenes para Hotel manager. Siga las instrucciones del mdico con respecto al control del colesterol y la presin arterial. Esta informacin no tiene Theme park manager el consejo del mdico. Asegrese de hacerle al mdico cualquier pregunta que tenga. Document Revised: 03/16/2021 Document Reviewed: 03/16/2021 Elsevier Patient Education  2024 [employer].

## 2024-06-27 LAB — HEPATITIS C ANTIBODY: Hepatitis C Ab: NONREACTIVE
